# Patient Record
Sex: Female | Born: 1940
Health system: Southern US, Community
[De-identification: ages and names within clinical notes are randomized; demographics above are authoritative.]

## PROBLEM LIST (undated history)

## (undated) DIAGNOSIS — M1990 Unspecified osteoarthritis, unspecified site: Secondary | ICD-10-CM

## (undated) DIAGNOSIS — R51 Headache: Secondary | ICD-10-CM

## (undated) DIAGNOSIS — K219 Gastro-esophageal reflux disease without esophagitis: Secondary | ICD-10-CM

## (undated) DIAGNOSIS — E039 Hypothyroidism, unspecified: Secondary | ICD-10-CM

## (undated) DIAGNOSIS — R519 Headache, unspecified: Secondary | ICD-10-CM

## (undated) DIAGNOSIS — B001 Herpesviral vesicular dermatitis: Secondary | ICD-10-CM

## (undated) HISTORY — PX: TOTAL HIP ARTHROPLASTY: SHX124

## (undated) HISTORY — PX: TUBAL LIGATION: SHX77

## (undated) HISTORY — PX: COLONOSCOPY: SHX174

## (undated) HISTORY — PX: BREAST CYST EXCISION: SHX579

## (undated) HISTORY — DX: Headache: R51

## (undated) HISTORY — DX: Headache, unspecified: R51.9

## (undated) HISTORY — PX: DILATION AND CURETTAGE OF UTERUS: SHX78

---

## 1991-03-14 HISTORY — PX: BUNIONECTOMY: SHX129

## 1998-08-11 ENCOUNTER — Ambulatory Visit (HOSPITAL_COMMUNITY): Admission: RE | Admit: 1998-08-11 | Discharge: 1998-08-11 | Payer: Self-pay | Admitting: Obstetrics & Gynecology

## 2001-03-01 ENCOUNTER — Ambulatory Visit (HOSPITAL_COMMUNITY): Admission: RE | Admit: 2001-03-01 | Discharge: 2001-03-01 | Payer: Self-pay | Admitting: Internal Medicine

## 2001-08-08 ENCOUNTER — Ambulatory Visit (HOSPITAL_COMMUNITY): Admission: RE | Admit: 2001-08-08 | Discharge: 2001-08-08 | Payer: Self-pay | Admitting: Internal Medicine

## 2003-07-14 ENCOUNTER — Other Ambulatory Visit: Admission: RE | Admit: 2003-07-14 | Discharge: 2003-07-14 | Payer: Self-pay | Admitting: *Deleted

## 2005-03-13 HISTORY — PX: ERCP: SHX60

## 2006-01-29 ENCOUNTER — Ambulatory Visit: Payer: Self-pay | Admitting: Internal Medicine

## 2006-02-05 ENCOUNTER — Ambulatory Visit: Payer: Self-pay | Admitting: Internal Medicine

## 2006-02-05 ENCOUNTER — Ambulatory Visit (HOSPITAL_COMMUNITY): Admission: RE | Admit: 2006-02-05 | Discharge: 2006-02-05 | Payer: Self-pay | Admitting: Internal Medicine

## 2007-03-14 HISTORY — PX: FINGER SURGERY: SHX640

## 2007-05-28 ENCOUNTER — Ambulatory Visit (HOSPITAL_BASED_OUTPATIENT_CLINIC_OR_DEPARTMENT_OTHER): Admission: RE | Admit: 2007-05-28 | Discharge: 2007-05-28 | Payer: Self-pay | Admitting: Orthopedic Surgery

## 2010-07-26 NOTE — Op Note (Signed)
NAME:  [name] [name], [name] [name]                ACCOUNT NO.:  [id number]   MEDICAL RECORD NO.:  [id number]          PATIENT TYPE:  AMB   LOCATION:  DSC                          FACILITY:  [facility]   PHYSICIAN:  Katy Fitch. Sypher, M.D. DATE OF BIRTH:  1940/09/27   DATE OF PROCEDURE:  DATE OF DISCHARGE:                               OPERATIVE REPORT   PREOPERATIVE DIAGNOSES:  1. Degenerative arthritis, left long finger distal interphalangeal      joint, with multiple loose bodies.  2. Chronic mucoid cyst, status post failed attempt at removal by      dermatologist x2.   POSTOPERATIVE DIAGNOSES:  1. Degenerative arthritis, left long finger distal interphalangeal      joint, with multiple loose bodies.  2. Chronic mucoid cyst, status post failed attempt at removal by      dermatologist x2.  3. Identification of multiple loose bodies and advanced degenerative      arthritis of left long finger distal interphalangeal joint with      severe subcutaneous scarring and recurrent mucoid cyst, dorsal      ulnar aspect of nail fold.   OPERATION:  1. Resection of dorsal ulnar nail fold mucoid cyst and full-thickness      skin debridement of degenerative dermal cyst.  2. Dorsal radial and dorsal ulnar arthrotomy of distal interphalangeal      joint with curettage of the multiple loose bodies, synovectomy and      irrigation with a dental needle.   OPERATIONS:  Katy Fitch. Sypher, M.D.   ASSISTANT:  Jonni Sanger, P.A.   ANESTHESIA:  2% lidocaine metacarpal head-level, block left long finger,  supplemented by IV sedation, supervising anesthesiologist is Janetta Hora.  Gelene Mink, M.D.   INDICATIONS:  [name] [name] is a 70 year old woman referred for  evaluation and management of a recurrent mucoid cyst of the left long  finger.  She had advanced degenerative arthritis of multiple distal  interphalangeal joints.  She had had treatment by her attending  dermatologist, Dr. Orvan Falconer, without eradication of the  cyst.  She had  multiple recurrences and had had rupture of the cyst.   She was advised to consider formal DIP joint debridement and cyst  debridement.  She is brought to the operating room at this time.  Preoperatively she was carefully counseled, pointing out the fact we  could not change her arthritis predicament with joint debridement.  Our  goal was to eradicate the recurrent mucoid cyst and prevent the  development of septic arthritis by repeating episodes of cyst rupture.  In addition, our attention was to debride loose bodies evident on her  preoperative x-rays.  This has been shown empirically to help prevent  the recurrence of mucoid cysts.   PROCEDURE:  [name] [name] is brought to the operating room and placed in  supine position on the operating table.   Following sedation under Dr. Thornton Dales direct supervision, the left  arm was prepped with Betadine soap and solution, sterilely draped.  A 2%  lidocaine block was placed at the metacarpal head level to obtain a  digital block.  The left long finger was exsanguinated with a gauze wrap and a quarter-  inch Penrose drain placed over the proximal phalangeal segment as a  digital tourniquet.   The procedure commenced with parallel dorsal radial and dorsal ulnar  incisions.  The extensor tendon was identified, the neurovascular  structures protected, capsulotomies performed dorsal radially and dorsal  ulnarly with resection of a triangular portion of the capsule between  the extensor tendon and the collateral ligament.  A micro curette was  used to thoroughly debride the joint of synovitis and multiple loose  bodies.  The joint was then explored with dural hooks and irrigated  thoroughly with sterile saline.  The mucoid cyst was then addressed  through an extension of the dorsal ulnar incision.  A large cyst cavity  was identified.  This was circumferentially dissected and removed with a  rongeur.  The skin that was quite  degenerative and thin was resected and  debrided to a normal-appearing dermal surface.   The skin margins were mobilized, followed by irrigation and repair of  the skin margins with simple suture of 5-0 nylon.   The wound was then dressed with Xeroflo, sterile gauze and an Alumafoam  splint.  There were no apparent complications.      Katy Fitch Sypher, M.D.  Electronically Signed     RVS/MEDQ  D:  05/28/2007  T:  05/28/2007  Job:  [id number]   cc:   Marcelino Duster

## 2010-07-29 NOTE — H&P (Signed)
NAME:  [name] [name]                ACCOUNT NO.:  [id number]   MEDICAL RECORD NO.:  [id number]           PATIENT TYPE:  AMB   LOCATION:                                FACILITY:  [facility]   PHYSICIAN:  Lionel December, M.D.    DATE OF BIRTH:  07/06/1940   DATE OF ADMISSION:  DATE OF DISCHARGE:  LH                                HISTORY & PHYSICAL   CHIEF COMPLAINT:  Rectal bleeding.   HISTORY OF PRESENT ILLNESS:  [name] [name] is a 70 year old Caucasian female.  She was last seen by [name] [name] in 2003.  She has a history of rectal  bleeding.  She tells me that, for three months now, she has had small to  moderate amounts of rectal bleeding with wiping, and she has noticed it is  bright red blood on the toilet paper.  She says her bowel movements are  generally soft and brown.  She denies any proctalgia or pruritus.  She  denies any abdominal pain.  Her weight has been stable.  She denies any  diarrhea or constipation.  She is taking Advil, approximately 600 mg, daily  for the last six months.  She also complains of daily heartburn and  indigestion, has been taking Prilosec 20 more recently on a daily basis.  She has had symptoms of chronic GERD practically every day of the week for  the last year.  She complains of breakthrough symptoms, especially late in  the evening.  Denies any dysphagia or odynophagia, anorexia or early  satiety.   PAST MEDICAL HISTORY:  She had a colonoscopy by [name] [name] on March 01, 2001.  She was found to have multiple small diverticula in the sigmoid colon  and moderate external hemorrhoids were present.  She also had a flex-sig on  August 14, 2001.  I do not have a complete report, but biopsy report showed  benign, edematous, mildly inflamed mucosal tissue.  She has a history of  tubal ligation, bunion surgery and thyroid disease.   CURRENT MEDICATIONS:  __________  0.625 mg daily, calcium 600 mg daily,  Advil 600 mg daily, Anusol HC suppositories p.r.n., Valtrex  250 mg daily,  Biotin once daily, Prilosec 20 mg daily.   ALLERGIES:  NO KNOWN DRUG ALLERGIES.   FAMILY HISTORY:  There is no family history of colorectal carcinoma, liver  or chronic GI problems.   SOCIAL HISTORY:  She is married.  She has one grown healthy child.  She is  retired from [employer].  She denies any tobacco or drug use.  She  drinks alcoholic beverages a couple of times a year.   REVIEW OF SYSTEMS:  CONSTITUTIONAL:  Weight is stable, denies any fevers or  chills.  CARDIOVASCULAR:  Denies any chest pain or palpitations.  PULMONARY:  Denies shortness of breath, dyspnea, cough, hemoptysis.  GI:  See HPI.   PHYSICAL EXAMINATION:  VITAL SIGNS:  Weight 164.5 pounds, height 66-1/2  inches, temp 97.6, blood pressure 118/70 and pulse 60.  GENERAL:  [name] [name] is a well-developed, well-nourished Caucasian female in  no acute distress.  HEENT:  __________ anicteric, conjunctivae pink, oropharynx pink and moist  without any lesions.  NECK:  Supple without mass or thyromegaly.  CHEST:  Heart is regular rate and rhythm with normal S1-S2, without murmurs,  rubs or gallops.  Lungs are clear to auscultation bilaterally.  ABDOMEN:  Positive bowel sounds times four, no bruits auscultated, soft,  nontender and nondistended, without palpable masses or hepatosplenomegaly.  No voluntary guarding.  EXTREMITIES:  Without clubbing or edema bilaterally.  SKIN:  Pink, warm, dry without any rash or jaundice.  RECTAL:  No external lesions visualized.  Good sphincter tone.  No internal  masses palpated.  Small amount of light brown stool obtained from the vault  which was hemoccult negative.   IMPRESSION:  [name] [name] is a 70 year old Caucasian female with a three month  history of daily small to moderate volume rectal bleeding.  Rectal exam in  the office is benign.  Last complete colonoscopy was approximately five  years ago.  She has had a flex-sig approximately four years ago.  Given  the  duration of her symptoms, for three months, and the fact that her last  complete exam was over five years ago, I feel we should proceed with  colonoscopy to determine the etiology of her bleeding.  She does have a  history of diverticular disease and could be having diverticular bleeding,  especially with her daily NSAID use.  She also has chronic daily heartburn  and indigestion, for at least the last year.  She has never had a screening  EGD for Barrett's.  Her symptoms are fairly well controlled on Prilosec 20  mg daily.  She has rare breakthrough symptoms in the evenings.   PLAN:  1. Diagnostic colonoscopy and screening EGD by [name] [name] in the near      future.  I have discussed these procedures, including risks and      benefits, which include but are not limited to bleeding, infection,      perforation, drug reaction.  She agrees to the plan.  Consent will be      obtained.  2. She is going to continue Prilosec 20 mg daily.      Nicholas Lose, N.P.      Lionel December, M.D.  Electronically Signed    KC/MEDQ  D:  01/29/2006  T:  01/29/2006  Job:  [id number]   cc:   Lionel December, M.D.  [address]  [place]  [place] [address]

## 2010-07-29 NOTE — Op Note (Signed)
NAME:  [name] [name], [name] [name]                ACCOUNT NO.:  [id number]   MEDICAL RECORD NO.:  [id number]          PATIENT TYPE:  AMB   LOCATION:  DAY                           FACILITY:  [facility]   PHYSICIAN:  Lionel December, M.D.    DATE OF BIRTH:  11/04/1940   DATE OF PROCEDURE:  02/05/2006  DATE OF DISCHARGE:                               OPERATIVE REPORT   PROCEDURE:  Esophagogastroduodenoscopy with esophageal dilation followed  by colonoscopy.   INDICATIONS:  Romelia is 70 year old Caucasian female with chronic GERD  whose symptoms are well-controlled with PPI who is undergoing EGD  primarily looking for Barrett's esophagus but she is today also  complaining of intermittent solid food dysphagia which she did not  mention to [place] while she was in the office.  She has intermittent  hematochezia usually small amount of blood is noted with her bowel  movements, at times this is moderate amount.  She is possibly bleeding  from hemorrhoids but since this is a recurrent symptom she is undergoing  evaluation.  Her last colonoscopy was in December 2002 and she had a  flexible sigmoidoscopy in June 2003.  Her family history is negative for  colorectal carcinoma.   Procedure risks were few reviewed the patient, informed consent was  obtained.   MEDS FOR CONSCIOUS SEDATION:  Benzocaine spray pharyngeal topical  anesthesia, Demerol 50 mg IV and Versed 8 mg IV in divided dose.   FINDINGS:  Procedure performed in endoscopy suite.  The patient's vital  signs and O2 sat were monitored during procedure and remained stable.   PROCEDURE #1 ESOPHAGOGASTRODUODENOSCOPY:  The patient was placed in left  lateral position and Olympus videoscope was passed through oropharynx  without any difficulty into esophagus.   Esophagus. Mucosa of the esophagus was normal throughout.  GE junction  was wavy, located at 38 cm from the incisors, but no ring or stricture  was noted.  Hiatus was at 40 cm from the incisors.   Stomach.  It was empty and distended very well with insufflation.  Folds  of proximal stomach were normal.  Examination mucosa at body, antrum,  pyloric channel as well as angularis, fundus and cardia was normal.   Duodenum bulbar mucosa was normal.  Scope was passed second part of  duodenum where mucosa and folds were normal.  Endoscope was withdrawn.   Esophagus was dilated by passing 56-French Maloney dilator to full  insertion.  Esophageal mucosa was reexamined post ED and no mucosal  disruption noted.  The patient was subsequently prepared for procedure  #2.   Colonoscopy.  Rectal examination performed.  No abnormality noted  external or digital exam.  Olympus videoscope was placed in rectum where  mucosa was noted to be normal.  Scope was retroflexed to examine  anorectal junction and moderate-sized hemorrhoids were noted below the  dentate line.  Pictures taken for the record.  Endoscope was  straightened and advanced to sigmoid colon beyond.  Preparation was  excellent.  Few small scattered diverticula noted at sigmoid colon.  Scope was passed into cecum which was identified by appendiceal  orifice  and ileocecal valve.  Short segment of TI was also examined was normal.  As the scope was withdrawn colonic mucosa was examined for the second  time and no orifice and ileocecal valve.  There was a 5-mm AVM at cecum  which was not bleeding and was left alone.  Short segment of TI was a  was also examined and was normal.  Colonic mucosa was carefully examined  as the scope was slowly withdrawn.  No polyps or other mucosal  abnormalities were noted.  Endoscope was withdrawn.  The patient  tolerated the procedure well.   FINAL DIAGNOSIS:  Small sliding hiatal hernia otherwise normal EGD.   Esophagus dilated by passing 56-French Maloney dilator given history of  dysphagia but no mucosal disruption noted in the esophagus.   External hemorrhoids felt to be source of recurrent/chronic   hematochezia.   Few small scattered diverticula at sigmoid colon.  Next a small  nonbleeding cecal AVM which was left alone.   RECOMMENDATIONS:  She will continue antireflux measures and Prilosec as  before.  She will also continue high-fiber diet.  __________ suppository  one per rectum at bedtime for 2-4 weeks.  Prescription given for 30  without refill.  She will keep symptom diary and bring [place] up to date in  3 months.      Lionel December, M.D.  Electronically Signed     NR/MEDQ  D:  02/05/2006  T:  02/05/2006  Job:  [id number]   cc:   Weyman Pedro  [address]  [place] [address]

## 2010-12-05 LAB — POCT HEMOGLOBIN-HEMACUE: Hemoglobin: 14.1

## 2011-04-25 DIAGNOSIS — M5430 Sciatica, unspecified side: Secondary | ICD-10-CM | POA: Diagnosis not present

## 2011-04-25 DIAGNOSIS — S335XXA Sprain of ligaments of lumbar spine, initial encounter: Secondary | ICD-10-CM | POA: Diagnosis not present

## 2011-04-25 DIAGNOSIS — M999 Biomechanical lesion, unspecified: Secondary | ICD-10-CM | POA: Diagnosis not present

## 2011-04-27 DIAGNOSIS — M999 Biomechanical lesion, unspecified: Secondary | ICD-10-CM | POA: Diagnosis not present

## 2011-04-27 DIAGNOSIS — M5430 Sciatica, unspecified side: Secondary | ICD-10-CM | POA: Diagnosis not present

## 2011-04-27 DIAGNOSIS — S335XXA Sprain of ligaments of lumbar spine, initial encounter: Secondary | ICD-10-CM | POA: Diagnosis not present

## 2011-05-01 DIAGNOSIS — S335XXA Sprain of ligaments of lumbar spine, initial encounter: Secondary | ICD-10-CM | POA: Diagnosis not present

## 2011-05-01 DIAGNOSIS — M5430 Sciatica, unspecified side: Secondary | ICD-10-CM | POA: Diagnosis not present

## 2011-05-01 DIAGNOSIS — M999 Biomechanical lesion, unspecified: Secondary | ICD-10-CM | POA: Diagnosis not present

## 2011-05-04 DIAGNOSIS — S335XXA Sprain of ligaments of lumbar spine, initial encounter: Secondary | ICD-10-CM | POA: Diagnosis not present

## 2011-05-04 DIAGNOSIS — M5430 Sciatica, unspecified side: Secondary | ICD-10-CM | POA: Diagnosis not present

## 2011-05-04 DIAGNOSIS — M999 Biomechanical lesion, unspecified: Secondary | ICD-10-CM | POA: Diagnosis not present

## 2011-05-11 DIAGNOSIS — M5430 Sciatica, unspecified side: Secondary | ICD-10-CM | POA: Diagnosis not present

## 2011-05-11 DIAGNOSIS — M999 Biomechanical lesion, unspecified: Secondary | ICD-10-CM | POA: Diagnosis not present

## 2011-05-18 DIAGNOSIS — M5430 Sciatica, unspecified side: Secondary | ICD-10-CM | POA: Diagnosis not present

## 2011-05-18 DIAGNOSIS — M999 Biomechanical lesion, unspecified: Secondary | ICD-10-CM | POA: Diagnosis not present

## 2011-05-24 DIAGNOSIS — Z1231 Encounter for screening mammogram for malignant neoplasm of breast: Secondary | ICD-10-CM | POA: Diagnosis not present

## 2011-05-25 DIAGNOSIS — S335XXA Sprain of ligaments of lumbar spine, initial encounter: Secondary | ICD-10-CM | POA: Diagnosis not present

## 2011-05-25 DIAGNOSIS — M5430 Sciatica, unspecified side: Secondary | ICD-10-CM | POA: Diagnosis not present

## 2011-05-25 DIAGNOSIS — M999 Biomechanical lesion, unspecified: Secondary | ICD-10-CM | POA: Diagnosis not present

## 2011-06-02 DIAGNOSIS — M25519 Pain in unspecified shoulder: Secondary | ICD-10-CM | POA: Diagnosis not present

## 2011-06-02 DIAGNOSIS — M67919 Unspecified disorder of synovium and tendon, unspecified shoulder: Secondary | ICD-10-CM | POA: Diagnosis not present

## 2011-06-06 DIAGNOSIS — K219 Gastro-esophageal reflux disease without esophagitis: Secondary | ICD-10-CM | POA: Diagnosis not present

## 2011-06-27 DIAGNOSIS — G9001 Carotid sinus syncope: Secondary | ICD-10-CM | POA: Diagnosis not present

## 2011-07-15 DIAGNOSIS — IMO0002 Reserved for concepts with insufficient information to code with codable children: Secondary | ICD-10-CM | POA: Diagnosis not present

## 2011-08-08 DIAGNOSIS — M169 Osteoarthritis of hip, unspecified: Secondary | ICD-10-CM | POA: Diagnosis not present

## 2011-08-08 DIAGNOSIS — M1610 Unilateral primary osteoarthritis, unspecified hip: Secondary | ICD-10-CM | POA: Diagnosis not present

## 2011-08-08 DIAGNOSIS — M25559 Pain in unspecified hip: Secondary | ICD-10-CM | POA: Diagnosis not present

## 2011-08-16 DIAGNOSIS — M25559 Pain in unspecified hip: Secondary | ICD-10-CM | POA: Diagnosis not present

## 2011-08-16 DIAGNOSIS — M169 Osteoarthritis of hip, unspecified: Secondary | ICD-10-CM | POA: Diagnosis not present

## 2011-08-16 DIAGNOSIS — M1610 Unilateral primary osteoarthritis, unspecified hip: Secondary | ICD-10-CM | POA: Diagnosis not present

## 2011-08-22 DIAGNOSIS — M1610 Unilateral primary osteoarthritis, unspecified hip: Secondary | ICD-10-CM | POA: Diagnosis not present

## 2011-10-04 DIAGNOSIS — R7989 Other specified abnormal findings of blood chemistry: Secondary | ICD-10-CM | POA: Diagnosis not present

## 2011-10-04 DIAGNOSIS — M169 Osteoarthritis of hip, unspecified: Secondary | ICD-10-CM | POA: Diagnosis not present

## 2011-10-12 HISTORY — PX: JOINT REPLACEMENT: SHX530

## 2011-10-16 DIAGNOSIS — E7800 Pure hypercholesterolemia, unspecified: Secondary | ICD-10-CM | POA: Diagnosis not present

## 2011-10-16 DIAGNOSIS — Z79899 Other long term (current) drug therapy: Secondary | ICD-10-CM | POA: Diagnosis not present

## 2011-10-20 DIAGNOSIS — Z0000 Encounter for general adult medical examination without abnormal findings: Secondary | ICD-10-CM | POA: Diagnosis not present

## 2011-10-20 DIAGNOSIS — B009 Herpesviral infection, unspecified: Secondary | ICD-10-CM | POA: Diagnosis not present

## 2011-10-20 DIAGNOSIS — G4762 Sleep related leg cramps: Secondary | ICD-10-CM | POA: Diagnosis not present

## 2011-10-24 DIAGNOSIS — I498 Other specified cardiac arrhythmias: Secondary | ICD-10-CM | POA: Diagnosis not present

## 2011-10-30 DIAGNOSIS — M169 Osteoarthritis of hip, unspecified: Secondary | ICD-10-CM | POA: Diagnosis not present

## 2011-10-30 DIAGNOSIS — E039 Hypothyroidism, unspecified: Secondary | ICD-10-CM | POA: Diagnosis present

## 2011-10-30 DIAGNOSIS — G8918 Other acute postprocedural pain: Secondary | ICD-10-CM | POA: Diagnosis not present

## 2011-10-30 DIAGNOSIS — M1610 Unilateral primary osteoarthritis, unspecified hip: Secondary | ICD-10-CM | POA: Diagnosis not present

## 2011-10-30 DIAGNOSIS — Z471 Aftercare following joint replacement surgery: Secondary | ICD-10-CM | POA: Diagnosis not present

## 2011-10-30 DIAGNOSIS — Z96649 Presence of unspecified artificial hip joint: Secondary | ICD-10-CM | POA: Diagnosis not present

## 2011-10-30 DIAGNOSIS — E876 Hypokalemia: Secondary | ICD-10-CM | POA: Diagnosis present

## 2011-10-30 DIAGNOSIS — K219 Gastro-esophageal reflux disease without esophagitis: Secondary | ICD-10-CM | POA: Diagnosis present

## 2011-11-03 DIAGNOSIS — Z471 Aftercare following joint replacement surgery: Secondary | ICD-10-CM | POA: Diagnosis not present

## 2011-11-03 DIAGNOSIS — G8918 Other acute postprocedural pain: Secondary | ICD-10-CM | POA: Diagnosis not present

## 2011-11-03 DIAGNOSIS — M6281 Muscle weakness (generalized): Secondary | ICD-10-CM | POA: Diagnosis not present

## 2011-11-03 DIAGNOSIS — Z96649 Presence of unspecified artificial hip joint: Secondary | ICD-10-CM | POA: Diagnosis not present

## 2011-11-03 DIAGNOSIS — R269 Unspecified abnormalities of gait and mobility: Secondary | ICD-10-CM | POA: Diagnosis not present

## 2011-11-04 DIAGNOSIS — M6281 Muscle weakness (generalized): Secondary | ICD-10-CM | POA: Diagnosis not present

## 2011-11-04 DIAGNOSIS — Z96649 Presence of unspecified artificial hip joint: Secondary | ICD-10-CM | POA: Diagnosis not present

## 2011-11-04 DIAGNOSIS — R269 Unspecified abnormalities of gait and mobility: Secondary | ICD-10-CM | POA: Diagnosis not present

## 2011-11-04 DIAGNOSIS — G8918 Other acute postprocedural pain: Secondary | ICD-10-CM | POA: Diagnosis not present

## 2011-11-04 DIAGNOSIS — Z471 Aftercare following joint replacement surgery: Secondary | ICD-10-CM | POA: Diagnosis not present

## 2011-11-05 DIAGNOSIS — Z471 Aftercare following joint replacement surgery: Secondary | ICD-10-CM | POA: Diagnosis not present

## 2011-11-05 DIAGNOSIS — Z96649 Presence of unspecified artificial hip joint: Secondary | ICD-10-CM | POA: Diagnosis not present

## 2011-11-05 DIAGNOSIS — R269 Unspecified abnormalities of gait and mobility: Secondary | ICD-10-CM | POA: Diagnosis not present

## 2011-11-05 DIAGNOSIS — G8918 Other acute postprocedural pain: Secondary | ICD-10-CM | POA: Diagnosis not present

## 2011-11-05 DIAGNOSIS — M6281 Muscle weakness (generalized): Secondary | ICD-10-CM | POA: Diagnosis not present

## 2011-11-07 DIAGNOSIS — Z96649 Presence of unspecified artificial hip joint: Secondary | ICD-10-CM | POA: Diagnosis not present

## 2011-11-07 DIAGNOSIS — Z471 Aftercare following joint replacement surgery: Secondary | ICD-10-CM | POA: Diagnosis not present

## 2011-11-07 DIAGNOSIS — M6281 Muscle weakness (generalized): Secondary | ICD-10-CM | POA: Diagnosis not present

## 2011-11-07 DIAGNOSIS — R269 Unspecified abnormalities of gait and mobility: Secondary | ICD-10-CM | POA: Diagnosis not present

## 2011-11-07 DIAGNOSIS — G8918 Other acute postprocedural pain: Secondary | ICD-10-CM | POA: Diagnosis not present

## 2011-11-08 DIAGNOSIS — M6281 Muscle weakness (generalized): Secondary | ICD-10-CM | POA: Diagnosis not present

## 2011-11-08 DIAGNOSIS — Z96649 Presence of unspecified artificial hip joint: Secondary | ICD-10-CM | POA: Diagnosis not present

## 2011-11-08 DIAGNOSIS — R269 Unspecified abnormalities of gait and mobility: Secondary | ICD-10-CM | POA: Diagnosis not present

## 2011-11-08 DIAGNOSIS — G8918 Other acute postprocedural pain: Secondary | ICD-10-CM | POA: Diagnosis not present

## 2011-11-08 DIAGNOSIS — Z471 Aftercare following joint replacement surgery: Secondary | ICD-10-CM | POA: Diagnosis not present

## 2011-11-10 DIAGNOSIS — G8918 Other acute postprocedural pain: Secondary | ICD-10-CM | POA: Diagnosis not present

## 2011-11-10 DIAGNOSIS — Z96649 Presence of unspecified artificial hip joint: Secondary | ICD-10-CM | POA: Diagnosis not present

## 2011-11-10 DIAGNOSIS — M6281 Muscle weakness (generalized): Secondary | ICD-10-CM | POA: Diagnosis not present

## 2011-11-10 DIAGNOSIS — R269 Unspecified abnormalities of gait and mobility: Secondary | ICD-10-CM | POA: Diagnosis not present

## 2011-11-10 DIAGNOSIS — Z471 Aftercare following joint replacement surgery: Secondary | ICD-10-CM | POA: Diagnosis not present

## 2011-11-11 DIAGNOSIS — G8918 Other acute postprocedural pain: Secondary | ICD-10-CM | POA: Diagnosis not present

## 2011-11-11 DIAGNOSIS — R269 Unspecified abnormalities of gait and mobility: Secondary | ICD-10-CM | POA: Diagnosis not present

## 2011-11-11 DIAGNOSIS — Z96649 Presence of unspecified artificial hip joint: Secondary | ICD-10-CM | POA: Diagnosis not present

## 2011-11-11 DIAGNOSIS — M6281 Muscle weakness (generalized): Secondary | ICD-10-CM | POA: Diagnosis not present

## 2011-11-11 DIAGNOSIS — Z471 Aftercare following joint replacement surgery: Secondary | ICD-10-CM | POA: Diagnosis not present

## 2011-11-15 DIAGNOSIS — M6281 Muscle weakness (generalized): Secondary | ICD-10-CM | POA: Diagnosis not present

## 2011-11-15 DIAGNOSIS — Z471 Aftercare following joint replacement surgery: Secondary | ICD-10-CM | POA: Diagnosis not present

## 2011-11-15 DIAGNOSIS — Z96649 Presence of unspecified artificial hip joint: Secondary | ICD-10-CM | POA: Diagnosis not present

## 2011-11-15 DIAGNOSIS — G8918 Other acute postprocedural pain: Secondary | ICD-10-CM | POA: Diagnosis not present

## 2011-11-15 DIAGNOSIS — R269 Unspecified abnormalities of gait and mobility: Secondary | ICD-10-CM | POA: Diagnosis not present

## 2011-11-16 DIAGNOSIS — R269 Unspecified abnormalities of gait and mobility: Secondary | ICD-10-CM | POA: Diagnosis not present

## 2011-11-16 DIAGNOSIS — Z96649 Presence of unspecified artificial hip joint: Secondary | ICD-10-CM | POA: Diagnosis not present

## 2011-11-16 DIAGNOSIS — G8918 Other acute postprocedural pain: Secondary | ICD-10-CM | POA: Diagnosis not present

## 2011-11-16 DIAGNOSIS — M6281 Muscle weakness (generalized): Secondary | ICD-10-CM | POA: Diagnosis not present

## 2011-11-16 DIAGNOSIS — Z471 Aftercare following joint replacement surgery: Secondary | ICD-10-CM | POA: Diagnosis not present

## 2011-11-18 DIAGNOSIS — Z471 Aftercare following joint replacement surgery: Secondary | ICD-10-CM | POA: Diagnosis not present

## 2011-11-18 DIAGNOSIS — G8918 Other acute postprocedural pain: Secondary | ICD-10-CM | POA: Diagnosis not present

## 2011-11-18 DIAGNOSIS — Z96649 Presence of unspecified artificial hip joint: Secondary | ICD-10-CM | POA: Diagnosis not present

## 2011-11-18 DIAGNOSIS — R269 Unspecified abnormalities of gait and mobility: Secondary | ICD-10-CM | POA: Diagnosis not present

## 2011-11-18 DIAGNOSIS — M6281 Muscle weakness (generalized): Secondary | ICD-10-CM | POA: Diagnosis not present

## 2011-11-22 DIAGNOSIS — Z96649 Presence of unspecified artificial hip joint: Secondary | ICD-10-CM | POA: Diagnosis not present

## 2011-11-22 DIAGNOSIS — M6281 Muscle weakness (generalized): Secondary | ICD-10-CM | POA: Diagnosis not present

## 2011-11-22 DIAGNOSIS — G8918 Other acute postprocedural pain: Secondary | ICD-10-CM | POA: Diagnosis not present

## 2011-11-22 DIAGNOSIS — Z471 Aftercare following joint replacement surgery: Secondary | ICD-10-CM | POA: Diagnosis not present

## 2011-11-22 DIAGNOSIS — R269 Unspecified abnormalities of gait and mobility: Secondary | ICD-10-CM | POA: Diagnosis not present

## 2011-11-25 DIAGNOSIS — M6281 Muscle weakness (generalized): Secondary | ICD-10-CM | POA: Diagnosis not present

## 2011-11-25 DIAGNOSIS — G8918 Other acute postprocedural pain: Secondary | ICD-10-CM | POA: Diagnosis not present

## 2011-11-25 DIAGNOSIS — R269 Unspecified abnormalities of gait and mobility: Secondary | ICD-10-CM | POA: Diagnosis not present

## 2011-11-25 DIAGNOSIS — Z471 Aftercare following joint replacement surgery: Secondary | ICD-10-CM | POA: Diagnosis not present

## 2011-11-25 DIAGNOSIS — Z96649 Presence of unspecified artificial hip joint: Secondary | ICD-10-CM | POA: Diagnosis not present

## 2011-12-13 DIAGNOSIS — M1710 Unilateral primary osteoarthritis, unspecified knee: Secondary | ICD-10-CM | POA: Diagnosis not present

## 2011-12-13 DIAGNOSIS — Z96649 Presence of unspecified artificial hip joint: Secondary | ICD-10-CM | POA: Diagnosis not present

## 2011-12-13 DIAGNOSIS — M25469 Effusion, unspecified knee: Secondary | ICD-10-CM | POA: Diagnosis not present

## 2011-12-13 DIAGNOSIS — Z471 Aftercare following joint replacement surgery: Secondary | ICD-10-CM | POA: Diagnosis not present

## 2011-12-28 DIAGNOSIS — Z23 Encounter for immunization: Secondary | ICD-10-CM | POA: Diagnosis not present

## 2012-01-24 DIAGNOSIS — L821 Other seborrheic keratosis: Secondary | ICD-10-CM | POA: Diagnosis not present

## 2012-01-24 DIAGNOSIS — L570 Actinic keratosis: Secondary | ICD-10-CM | POA: Diagnosis not present

## 2012-01-24 DIAGNOSIS — L819 Disorder of pigmentation, unspecified: Secondary | ICD-10-CM | POA: Diagnosis not present

## 2012-01-25 DIAGNOSIS — Z471 Aftercare following joint replacement surgery: Secondary | ICD-10-CM | POA: Diagnosis not present

## 2012-01-25 DIAGNOSIS — Z96649 Presence of unspecified artificial hip joint: Secondary | ICD-10-CM | POA: Diagnosis not present

## 2012-04-24 DIAGNOSIS — K219 Gastro-esophageal reflux disease without esophagitis: Secondary | ICD-10-CM | POA: Diagnosis not present

## 2012-04-24 DIAGNOSIS — R5381 Other malaise: Secondary | ICD-10-CM | POA: Diagnosis not present

## 2012-05-08 DIAGNOSIS — L909 Atrophic disorder of skin, unspecified: Secondary | ICD-10-CM | POA: Diagnosis not present

## 2012-05-08 DIAGNOSIS — B079 Viral wart, unspecified: Secondary | ICD-10-CM | POA: Diagnosis not present

## 2012-05-08 DIAGNOSIS — D485 Neoplasm of uncertain behavior of skin: Secondary | ICD-10-CM | POA: Diagnosis not present

## 2012-05-08 DIAGNOSIS — L820 Inflamed seborrheic keratosis: Secondary | ICD-10-CM | POA: Diagnosis not present

## 2012-05-08 DIAGNOSIS — L919 Hypertrophic disorder of the skin, unspecified: Secondary | ICD-10-CM | POA: Diagnosis not present

## 2012-05-29 DIAGNOSIS — D492 Neoplasm of unspecified behavior of bone, soft tissue, and skin: Secondary | ICD-10-CM | POA: Diagnosis not present

## 2012-05-29 DIAGNOSIS — M19049 Primary osteoarthritis, unspecified hand: Secondary | ICD-10-CM | POA: Diagnosis not present

## 2012-05-29 DIAGNOSIS — Z1231 Encounter for screening mammogram for malignant neoplasm of breast: Secondary | ICD-10-CM | POA: Diagnosis not present

## 2012-05-30 ENCOUNTER — Encounter (HOSPITAL_BASED_OUTPATIENT_CLINIC_OR_DEPARTMENT_OTHER): Payer: Self-pay | Admitting: *Deleted

## 2012-05-30 ENCOUNTER — Other Ambulatory Visit: Payer: Self-pay | Admitting: Orthopedic Surgery

## 2012-05-31 ENCOUNTER — Encounter (HOSPITAL_BASED_OUTPATIENT_CLINIC_OR_DEPARTMENT_OTHER): Payer: Self-pay | Admitting: *Deleted

## 2012-05-31 NOTE — Progress Notes (Signed)
No labs needed

## 2012-06-10 DIAGNOSIS — J158 Pneumonia due to other specified bacteria: Secondary | ICD-10-CM | POA: Diagnosis not present

## 2012-06-10 DIAGNOSIS — R509 Fever, unspecified: Secondary | ICD-10-CM | POA: Diagnosis not present

## 2012-07-05 ENCOUNTER — Encounter (HOSPITAL_BASED_OUTPATIENT_CLINIC_OR_DEPARTMENT_OTHER): Payer: Self-pay | Admitting: *Deleted

## 2012-07-05 ENCOUNTER — Other Ambulatory Visit: Payer: Self-pay | Admitting: Orthopedic Surgery

## 2012-07-05 DIAGNOSIS — M19049 Primary osteoarthritis, unspecified hand: Secondary | ICD-10-CM | POA: Diagnosis not present

## 2012-07-05 DIAGNOSIS — D492 Neoplasm of unspecified behavior of bone, soft tissue, and skin: Secondary | ICD-10-CM | POA: Diagnosis not present

## 2012-07-05 NOTE — Progress Notes (Signed)
Surgery was r/s due to infection in hand-no labs needed

## 2012-07-09 ENCOUNTER — Encounter (HOSPITAL_BASED_OUTPATIENT_CLINIC_OR_DEPARTMENT_OTHER): Payer: Self-pay

## 2012-07-09 ENCOUNTER — Encounter (HOSPITAL_BASED_OUTPATIENT_CLINIC_OR_DEPARTMENT_OTHER): Payer: Self-pay | Admitting: Certified Registered Nurse Anesthetist

## 2012-07-09 ENCOUNTER — Ambulatory Visit (HOSPITAL_BASED_OUTPATIENT_CLINIC_OR_DEPARTMENT_OTHER)
Admission: RE | Admit: 2012-07-09 | Discharge: 2012-07-09 | Disposition: A | Discharge status: Home / Self Care | Payer: Medicare Other | Source: Ambulatory Visit | Attending: Orthopedic Surgery | Admitting: Orthopedic Surgery

## 2012-07-09 ENCOUNTER — Ambulatory Visit (HOSPITAL_BASED_OUTPATIENT_CLINIC_OR_DEPARTMENT_OTHER): Payer: Medicare Other | Admitting: Certified Registered Nurse Anesthetist

## 2012-07-09 ENCOUNTER — Encounter (HOSPITAL_BASED_OUTPATIENT_CLINIC_OR_DEPARTMENT_OTHER)
Admission: RE | Disposition: A | Discharge status: Home / Self Care | Payer: Self-pay | Source: Ambulatory Visit | Attending: Orthopedic Surgery

## 2012-07-09 DIAGNOSIS — M6740 Ganglion, unspecified site: Secondary | ICD-10-CM | POA: Diagnosis not present

## 2012-07-09 DIAGNOSIS — M898X9 Other specified disorders of bone, unspecified site: Secondary | ICD-10-CM | POA: Insufficient documentation

## 2012-07-09 DIAGNOSIS — Z79899 Other long term (current) drug therapy: Secondary | ICD-10-CM | POA: Insufficient documentation

## 2012-07-09 DIAGNOSIS — E039 Hypothyroidism, unspecified: Secondary | ICD-10-CM | POA: Diagnosis not present

## 2012-07-09 DIAGNOSIS — M129 Arthropathy, unspecified: Secondary | ICD-10-CM | POA: Insufficient documentation

## 2012-07-09 DIAGNOSIS — K219 Gastro-esophageal reflux disease without esophagitis: Secondary | ICD-10-CM | POA: Diagnosis not present

## 2012-07-09 DIAGNOSIS — D2110 Benign neoplasm of connective and other soft tissue of unspecified upper limb, including shoulder: Secondary | ICD-10-CM | POA: Diagnosis not present

## 2012-07-09 DIAGNOSIS — D492 Neoplasm of unspecified behavior of bone, soft tissue, and skin: Secondary | ICD-10-CM | POA: Diagnosis not present

## 2012-07-09 DIAGNOSIS — M19049 Primary osteoarthritis, unspecified hand: Secondary | ICD-10-CM | POA: Diagnosis not present

## 2012-07-09 SURGERY — EXCISION METACARPAL MASS
Anesthesia: General | Site: Finger | Laterality: Right | Wound class: Clean

## 2012-07-09 MED ORDER — CHLORHEXIDINE GLUCONATE 4 % EX LIQD: 60.0000 mL | Freq: Once | CUTANEOUS | Status: DC

## 2012-07-09 MED ORDER — OXYCODONE-ACETAMINOPHEN 5-325 MG PO TABS: ORAL_TABLET | ORAL | Status: DC

## 2012-07-09 MED ORDER — MIDAZOLAM HCL 2 MG/2ML IJ SOLN
1.0000 mg | INTRAMUSCULAR | Status: DC | PRN
Start: 2012-07-09 — End: 2012-07-09

## 2012-07-09 MED ORDER — ONDANSETRON HCL 4 MG/2ML IJ SOLN
INTRAMUSCULAR | Status: DC | PRN
  Administered 2012-07-09: 4 mg via INTRAVENOUS

## 2012-07-09 MED ORDER — FENTANYL CITRATE 0.05 MG/ML IJ SOLN: 50.0000 ug | INTRAMUSCULAR | Status: DC | PRN

## 2012-07-09 MED ORDER — ONDANSETRON HCL 4 MG/2ML IJ SOLN: 4.0000 mg | Freq: Once | INTRAMUSCULAR | Status: DC | PRN

## 2012-07-09 MED ORDER — LIDOCAINE HCL (CARDIAC) 20 MG/ML IV SOLN
INTRAVENOUS | Status: DC | PRN
  Administered 2012-07-09: 60 mg via INTRAVENOUS

## 2012-07-09 MED ORDER — BUPIVACAINE HCL (PF) 0.25 % IJ SOLN
INTRAMUSCULAR | Status: DC | PRN
  Administered 2012-07-09: 10 mL

## 2012-07-09 MED ORDER — OXYCODONE HCL 5 MG/5ML PO SOLN: 5.0000 mg | Freq: Once | ORAL | Status: DC | PRN

## 2012-07-09 MED ORDER — OXYCODONE HCL 5 MG PO TABS: 5.0000 mg | ORAL_TABLET | Freq: Once | ORAL | Status: DC | PRN

## 2012-07-09 MED ORDER — HYDROMORPHONE HCL PF 1 MG/ML IJ SOLN: 0.2500 mg | INTRAMUSCULAR | Status: DC | PRN

## 2012-07-09 MED ORDER — FENTANYL CITRATE 0.05 MG/ML IJ SOLN
INTRAMUSCULAR | Status: DC | PRN
  Administered 2012-07-09: 50 ug via INTRAVENOUS

## 2012-07-09 MED ORDER — CEFAZOLIN SODIUM-DEXTROSE 2-3 GM-% IV SOLR
2.0000 g | INTRAVENOUS | Status: AC
  Administered 2012-07-09: 2 g via INTRAVENOUS

## 2012-07-09 MED ORDER — LACTATED RINGERS IV SOLN
INTRAVENOUS | Status: DC
  Administered 2012-07-09 (×2): via INTRAVENOUS

## 2012-07-09 MED ORDER — DEXAMETHASONE SODIUM PHOSPHATE 10 MG/ML IJ SOLN
INTRAMUSCULAR | Status: DC | PRN
  Administered 2012-07-09: 10 mg via INTRAVENOUS

## 2012-07-09 MED ORDER — PROPOFOL 10 MG/ML IV BOLUS
INTRAVENOUS | Status: DC | PRN
  Administered 2012-07-09: 150 mg via INTRAVENOUS

## 2012-07-09 SURGICAL SUPPLY — 56 items
APL SKNCLS STERI-STRIP NONHPOA (GAUZE/BANDAGES/DRESSINGS)
BANDAGE COBAN STERILE 2 (GAUZE/BANDAGES/DRESSINGS) IMPLANT
BANDAGE CONFORM 2  STR LF (GAUZE/BANDAGES/DRESSINGS) IMPLANT
BANDAGE ELASTIC 3 VELCRO ST LF (GAUZE/BANDAGES/DRESSINGS) IMPLANT
BANDAGE GAUZE ELAST BULKY 4 IN (GAUZE/BANDAGES/DRESSINGS) IMPLANT
BANDAGE GAUZE STRT 1 STR LF (GAUZE/BANDAGES/DRESSINGS) IMPLANT
BENZOIN TINCTURE PRP APPL 2/3 (GAUZE/BANDAGES/DRESSINGS) IMPLANT
BLADE MINI RND TIP GREEN BEAV (BLADE) IMPLANT
BLADE SURG 15 STRL LF DISP TIS (BLADE) ×2 IMPLANT
BLADE SURG 15 STRL SS (BLADE) ×4
BNDG CMPR 9X4 STRL LF SNTH (GAUZE/BANDAGES/DRESSINGS) ×1
BNDG CMPR MD 5X2 ELC HKLP STRL (GAUZE/BANDAGES/DRESSINGS)
BNDG COHESIVE 1X5 TAN STRL LF (GAUZE/BANDAGES/DRESSINGS) ×1 IMPLANT
BNDG ELASTIC 2 VLCR STRL LF (GAUZE/BANDAGES/DRESSINGS) IMPLANT
BNDG ESMARK 4X9 LF (GAUZE/BANDAGES/DRESSINGS) ×1 IMPLANT
BNDG PLASTER X FAST 3X3 WHT LF (CAST SUPPLIES) IMPLANT
BNDG PLSTR 9X3 FST ST WHT (CAST SUPPLIES)
CHLORAPREP W/TINT 26ML (MISCELLANEOUS) ×2 IMPLANT
CLOTH BEACON ORANGE TIMEOUT ST (SAFETY) ×2 IMPLANT
CORDS BIPOLAR (ELECTRODE) ×2 IMPLANT
COVER MAYO STAND STRL (DRAPES) ×2 IMPLANT
COVER TABLE BACK 60X90 (DRAPES) ×2 IMPLANT
CUFF TOURNIQUET SINGLE 18IN (TOURNIQUET CUFF) ×2 IMPLANT
DRAPE EXTREMITY T 121X128X90 (DRAPE) ×2 IMPLANT
DRAPE SURG 17X23 STRL (DRAPES) ×2 IMPLANT
GAUZE XEROFORM 1X8 LF (GAUZE/BANDAGES/DRESSINGS) ×2 IMPLANT
GLOVE BIO SURGEON STRL SZ 6.5 (GLOVE) ×1 IMPLANT
GLOVE BIO SURGEON STRL SZ7.5 (GLOVE) ×2 IMPLANT
GLOVE BIOGEL PI IND STRL 7.0 (GLOVE) IMPLANT
GLOVE BIOGEL PI IND STRL 8 (GLOVE) ×1 IMPLANT
GLOVE BIOGEL PI INDICATOR 7.0 (GLOVE) ×1
GLOVE BIOGEL PI INDICATOR 8 (GLOVE) ×1
GLOVE EXAM NITRILE MD LF STRL (GLOVE) ×1 IMPLANT
GOWN BRE IMP PREV XXLGXLNG (GOWN DISPOSABLE) ×3 IMPLANT
GOWN PREVENTION PLUS XLARGE (GOWN DISPOSABLE) ×2 IMPLANT
NDL HYPO 25X1 1.5 SAFETY (NEEDLE) ×1 IMPLANT
NEEDLE HYPO 25X1 1.5 SAFETY (NEEDLE) ×2 IMPLANT
NS IRRIG 1000ML POUR BTL (IV SOLUTION) ×2 IMPLANT
PACK BASIN DAY SURGERY FS (CUSTOM PROCEDURE TRAY) ×2 IMPLANT
PAD CAST 3X4 CTTN HI CHSV (CAST SUPPLIES) IMPLANT
PAD CAST 4YDX4 CTTN HI CHSV (CAST SUPPLIES) IMPLANT
PADDING CAST ABS 4INX4YD NS (CAST SUPPLIES) ×1
PADDING CAST ABS COTTON 4X4 ST (CAST SUPPLIES) ×1 IMPLANT
PADDING CAST COTTON 3X4 STRL (CAST SUPPLIES)
PADDING CAST COTTON 4X4 STRL (CAST SUPPLIES)
SPLINT FNGR PLAIN END 5/8X3.25 (CAST SUPPLIES) IMPLANT
SPLINT PLASTALUME 3 1/4 (CAST SUPPLIES) ×2
SPONGE GAUZE 4X4 12PLY (GAUZE/BANDAGES/DRESSINGS) ×2 IMPLANT
STOCKINETTE 4X48 STRL (DRAPES) ×2 IMPLANT
STRIP CLOSURE SKIN 1/2X4 (GAUZE/BANDAGES/DRESSINGS) IMPLANT
SUT ETHILON 3 0 PS 1 (SUTURE) IMPLANT
SUT ETHILON 4 0 PS 2 18 (SUTURE) ×2 IMPLANT
SYR BULB 3OZ (MISCELLANEOUS) ×2 IMPLANT
SYR CONTROL 10ML LL (SYRINGE) ×2 IMPLANT
TOWEL OR 17X24 6PK STRL BLUE (TOWEL DISPOSABLE) ×4 IMPLANT
UNDERPAD 30X30 INCONTINENT (UNDERPADS AND DIAPERS) ×2 IMPLANT

## 2012-07-09 NOTE — Anesthesia Postprocedure Evaluation (Signed)
  Anesthesia Post-op Note  Patient: [name] [name]  Procedure(s) Performed: Procedure(s): RIGHT LONG FINGER EXCISION MASS AND DIP JOINT DEBRIDEMENT  (Right)  Patient Location: PACU  Anesthesia Type:General  Level of Consciousness: awake, alert  and oriented  Airway and Oxygen Therapy: Patient Spontanous Breathing  Post-op Pain: mild  Post-op Assessment: Post-op Vital signs reviewed  Post-op Vital Signs: Reviewed  Complications: No apparent anesthesia complications

## 2012-07-09 NOTE — Transfer of Care (Signed)
Immediate Anesthesia Transfer of Care Note  Patient: [name] [name]  Procedure(s) Performed: Procedure(s): RIGHT LONG FINGER EXCISION MASS AND DIP JOINT DEBRIDEMENT  (Right)  Patient Location: PACU  Anesthesia Type:General  Level of Consciousness: awake, alert , oriented and patient cooperative  Airway & Oxygen Therapy: Patient Spontanous Breathing and Patient connected to face mask oxygen  Post-op Assessment: Report given to PACU RN and Post -op Vital signs reviewed and stable  Post vital signs: Reviewed and stable  Complications: No apparent anesthesia complications

## 2012-07-09 NOTE — Op Note (Signed)
[id number] 

## 2012-07-09 NOTE — Anesthesia Preprocedure Evaluation (Signed)
Anesthesia Evaluation  Patient identified by MRN, date of birth, ID band Patient awake    Reviewed: Allergy & Precautions, H&P , NPO status , Patient's Chart, lab work & pertinent test results  Airway Mallampati: I TM Distance: >3 FB Neck ROM: Full    Dental  (+) Teeth Intact and Dental Advisory Given   Pulmonary  breath sounds clear to auscultation        Cardiovascular Rhythm:Regular Rate:Normal     Neuro/Psych    GI/Hepatic GERD-  Medicated and Controlled,  Endo/Other    Renal/GU      Musculoskeletal   Abdominal   Peds  Hematology   Anesthesia Other Findings   Reproductive/Obstetrics                           Anesthesia Physical Anesthesia Plan  ASA: I  Anesthesia Plan: Bier Block and MAC   Post-op Pain Management:    Induction: Intravenous  Airway Management Planned: Simple Face Mask  Additional Equipment:   Intra-op Plan:   Post-operative Plan:   Informed Consent: I have reviewed the patients History and Physical, chart, labs and discussed the procedure including the risks, benefits and alternatives for the proposed anesthesia with the patient or authorized representative who has indicated his/her understanding and acceptance.   Dental advisory given  Plan Discussed with: CRNA, Anesthesiologist and Surgeon  Anesthesia Plan Comments:         Anesthesia Quick Evaluation

## 2012-07-09 NOTE — H&P (Signed)
  [name] [name] is an 72 y.o. female.   Chief Complaint: right long finger mass HPI: 72 yo rhd female with 6 week history of mass on right long finger.  Has broken open and drained.  Pain when bumped.  It is bothersome to her.  She wishes to have it removed.  Past Medical History  Diagnosis Date  . GERD (gastroesophageal reflux disease)   . Arthritis   . Hypothyroidism   . Diverticulitis     hx  . Wears glasses   . Fever blister     Past Surgical History  Procedure Laterality Date  . Colonoscopy    . Ercp  2007  . Finger surgery  2009    mucoid cyst lt long finger  . Joint replacement  8/13    total rt hip-[facility]  . Bunionectomy  1993    both feet  . Dilation and curettage of uterus    . Tubal ligation      History reviewed. No pertinent family history. Social History:  reports that she has never smoked. She does not have any smokeless tobacco history on file. She reports that  drinks alcohol. She reports that she does not use illicit drugs.  Allergies: No Known Allergies  Medications Prior to Admission  Medication Sig Dispense Refill  . calcium-vitamin D (OSCAL WITH D) 500-200 MG-UNIT per tablet Take 1 tablet by mouth daily.      . cholecalciferol (VITAMIN D) 1000 UNITS tablet Take 1,000 Units by mouth daily.      . fish oil-omega-3 fatty acids 1000 MG capsule Take 2 g by mouth daily.      Marland Kitchen glucosamine-chondroitin 500-400 MG tablet Take 1 tablet by mouth 2 (two) times daily before a meal.      . levothyroxine (SYNTHROID, LEVOTHROID) 75 MCG tablet Take 75 mcg by mouth daily.      Marland Kitchen omeprazole (PRILOSEC) 40 MG capsule Take 40 mg by mouth daily.      . valACYclovir (VALTREX) 500 MG tablet Take 500 mg by mouth daily. Takes 1/2        Results for orders placed during the hospital encounter of 07/09/12 (from the past 48 hour(s))  POCT HEMOGLOBIN-HEMACUE     Status: None   Collection Time    07/09/12  8:16 AM      Result Value Range   Hemoglobin 14.7  12.0 - 15.0 g/dL     No results found.   A comprehensive review of systems was negative except for: Eyes: positive for contacts/glasses  Blood pressure 140/68, pulse 60, temperature 98.2 F (36.8 C), temperature source Oral, resp. rate 16, height 5\' 6"  (1.676 m), weight 69.911 kg (154 lb 2 oz), SpO2 98.00%.  General appearance: alert, cooperative and appears stated age Head: Normocephalic, without obvious abnormality, atraumatic Neck: supple, symmetrical, trachea midline Resp: clear to auscultation bilaterally Cardio: regular rate and rhythm GI: non tender Extremities: intact sensation and capillary refill all digits.  +epl/fpl/io.  cyst distal aspect right long finger.  no erythema. Pulses: 2+ and symmetric Skin: Skin color, texture, turgor normal. No rashes or lesions Neurologic: Grossly normal Incision/Wound: na  Assessment/Plan Right long finger mucoid cyst and dip arthrosis.  Non operative and operative treatment options were discussed with the patient and patient wishes to proceed with operative treatment. Risks, benefits, and alternatives of surgery were discussed and the patient agrees with the plan of care.   Abilene Mcphee R 07/09/2012, 8:30 AM

## 2012-07-09 NOTE — Brief Op Note (Signed)
07/09/2012  9:30 AM  PATIENT:  [name] [name]  72 y.o. female  PRE-OPERATIVE DIAGNOSIS:  right long finger mucoid cyst   POST-OPERATIVE DIAGNOSIS:  right long finger mucoid cyst   PROCEDURE:  Procedure(s): RIGHT LONG FINGER EXCISION MASS AND DIP JOINT DEBRIDEMENT  (Right)  SURGEON:  Surgeon(s) and Role:    * Tami Ribas, MD - Primary    * Nicki Reaper, MD - Assisting  PHYSICIAN ASSISTANT:   ASSISTANTS: Cindee Salt, MD   ANESTHESIA:   general  EBL:  Total I/O In: 1000 [I.V.:1000] Out: -   BLOOD ADMINISTERED:none  DRAINS: none   LOCAL MEDICATIONS USED:  MARCAINE     SPECIMEN:  Source of Specimen:  right long finger  DISPOSITION OF SPECIMEN:  PATHOLOGY  COUNTS:  YES  TOURNIQUET:   Total Tourniquet Time Documented: Forearm (Right) - 21 minutes Total: Forearm (Right) - 21 minutes   DICTATION: .Other Dictation: Dictation Number [id number]  PLAN OF CARE: Discharge to home after PACU  PATIENT DISPOSITION:  PACU - hemodynamically stable.

## 2012-07-09 NOTE — Anesthesia Procedure Notes (Signed)
Procedure Name: LMA Insertion Date/Time: 07/09/2012 8:46 AM Performed by: Takia Runyon D Pre-anesthesia Checklist: Patient identified, Emergency Drugs available, Suction available and Patient being monitored Patient Re-evaluated:Patient Re-evaluated prior to inductionOxygen Delivery Method: Circle System Utilized Preoxygenation: Pre-oxygenation with 100% oxygen Intubation Type: IV induction Ventilation: Mask ventilation without difficulty LMA: LMA inserted LMA Size: 4.0 Number of attempts: 1 Airway Equipment and Method: bite block Placement Confirmation: positive ETCO2 Tube secured with: Tape Dental Injury: Teeth and Oropharynx as per pre-operative assessment

## 2012-07-10 NOTE — Op Note (Signed)
NAMELILLIS, NUTTLE                ACCOUNT NO.:  [id number]  MEDICAL RECORD NO.:  [id number]  LOCATION:                                 FACILITY:  PHYSICIAN:  Betha Loa, MD             DATE OF BIRTH:  DATE OF PROCEDURE:  07/09/2012 DATE OF DISCHARGE:                              OPERATIVE REPORT   PREOPERATIVE DIAGNOSES: 1. Right long finger mucoid cyst. 2. Distal interphalangeal joint arthrosis.  POSTOPERATIVE DIAGNOSES: 1. Right long finger mucoid cyst. 2. Distal interphalangeal joint arthrosis.  PROCEDURES: 1. Right long finger excision of mucoid cyst. 2. Debridement of distal interphalangeal joint.  SURGEON:  Betha Loa, MD  ASSISTANT:  Cindee Salt, MD  ANESTHESIA:  General.  IV FLUIDS:  Per Anesthesia flow sheet.  ESTIMATED BLOOD LOSS:  Minimal.  COMPLICATIONS:  None.  SPECIMENS:  Right long finger mucoid cyst to Pathology.  TOURNIQUET TIME:  21 minutes.  DISPOSITION:  Stable to PACU.  INDICATIONS:  Ms. Vasco is a 72 year old right-hand-dominant female who has noted a cyst on the right long finger for approximately 6 weeks.  It is bothersome to her.  It is broken, open, and leaked clear fluid in the past.  She would like it removed.  Risks, benefits, and alternatives of surgery were discussed including risk of blood loss, infection, damage to nerves, vessels, tendons, ligaments, bone, failure of surgery, need for additional surgery, complications with wound healing, continued pain, and recurrence of cyst.  She voiced understanding of these risks and elected to proceed.  OPERATIVE COURSE:  After being identified preoperatively by myself, the patient and I agreed upon the procedure and site of the procedure. Surgical site was marked.  The risks, benefits, and alternatives of surgery were reviewed and she wished to proceed.  Surgical consent had been signed.  She was given IV Ancef as preoperative antibiotic prophylaxis.  She was transported to the  operating room, placed on the operating room table in supine position with the right upper extremity on arm board.  General anesthesia was induced by anesthesiologist.  The right upper extremity was prepped and draped in normal sterile orthopedic fashion.  Surgical pause was performed between surgeons, Anesthesia, operating staff, and all were in agreement as to the patient, procedure, and site of the procedure.  Tourniquet at the proximal aspect of the extremity was inflated to 250 mmHg after exsanguination of the limb with an Esmarch bandage.  A hockey stick incision was made over the DIP joint of the right long finger.  This was carried into subcutaneous tissues by spreading technique.  The cyst was identified and excised.  It was sent to Pathology for examination.  The DIP joint was entered from the radial side underneath the extensor tendon.  It was debrided using the rongeurs and the curette.  All osteophytes that could be seen were removed.  The wound was copiously irrigated with sterile saline.  It was closed with 4-0 nylon in a horizontal mattress fashion.  Digital block was performed with 10 mL of 0.25% plain Marcaine to aid in postoperative analgesia.  The wound was then dressed with sterile Xeroform, 4x4, and  wrapped with a Coban dressing lightly. Alumafoam splint was placed and wrapped with Coban as well.  The tourniquet was deflated to 21 minutes.  Fingertips were pink with brisk capillary refill after deflation of tourniquet.  Operative drapes were broken down and the patient was awoken from anesthesia safely.  She was transferred back to the stretcher and taken to the PACU in stable condition.  I will see her back in the office in 1 week for postoperative followup.  I will give her Percocet 5/325, 1 to 2 p.o. q.6 hours p.r.n. pain, dispensed #30.     Betha Loa, MD     KK/MEDQ  D:  07/09/2012  T:  07/10/2012  Job:  [id number]

## 2012-07-23 DIAGNOSIS — Q828 Other specified congenital malformations of skin: Secondary | ICD-10-CM | POA: Diagnosis not present

## 2012-07-23 DIAGNOSIS — M79609 Pain in unspecified limb: Secondary | ICD-10-CM | POA: Diagnosis not present

## 2012-07-30 ENCOUNTER — Ambulatory Visit (HOSPITAL_BASED_OUTPATIENT_CLINIC_OR_DEPARTMENT_OTHER): Admission: RE | Admit: 2012-07-30 | Payer: Medicare Other | Source: Ambulatory Visit | Admitting: Orthopedic Surgery

## 2012-07-30 HISTORY — DX: Unspecified osteoarthritis, unspecified site: M19.90

## 2012-07-30 HISTORY — DX: Gastro-esophageal reflux disease without esophagitis: K21.9

## 2012-07-30 HISTORY — DX: Hypothyroidism, unspecified: E03.9

## 2012-07-30 HISTORY — DX: Herpesviral vesicular dermatitis: B00.1

## 2012-10-25 DIAGNOSIS — Z96649 Presence of unspecified artificial hip joint: Secondary | ICD-10-CM | POA: Diagnosis not present

## 2012-10-25 DIAGNOSIS — E782 Mixed hyperlipidemia: Secondary | ICD-10-CM | POA: Diagnosis not present

## 2012-10-25 DIAGNOSIS — Z471 Aftercare following joint replacement surgery: Secondary | ICD-10-CM | POA: Diagnosis not present

## 2012-10-25 DIAGNOSIS — M1610 Unilateral primary osteoarthritis, unspecified hip: Secondary | ICD-10-CM | POA: Diagnosis not present

## 2012-10-25 DIAGNOSIS — K219 Gastro-esophageal reflux disease without esophagitis: Secondary | ICD-10-CM | POA: Diagnosis not present

## 2012-10-30 DIAGNOSIS — Z0000 Encounter for general adult medical examination without abnormal findings: Secondary | ICD-10-CM | POA: Diagnosis not present

## 2012-10-31 DIAGNOSIS — H26499 Other secondary cataract, unspecified eye: Secondary | ICD-10-CM | POA: Diagnosis not present

## 2012-11-28 DIAGNOSIS — R141 Gas pain: Secondary | ICD-10-CM | POA: Diagnosis not present

## 2012-11-28 DIAGNOSIS — R1011 Right upper quadrant pain: Secondary | ICD-10-CM | POA: Diagnosis not present

## 2012-11-28 DIAGNOSIS — R109 Unspecified abdominal pain: Secondary | ICD-10-CM | POA: Diagnosis not present

## 2012-11-28 DIAGNOSIS — R197 Diarrhea, unspecified: Secondary | ICD-10-CM | POA: Diagnosis not present

## 2012-12-13 DIAGNOSIS — M75120 Complete rotator cuff tear or rupture of unspecified shoulder, not specified as traumatic: Secondary | ICD-10-CM | POA: Diagnosis not present

## 2012-12-18 DIAGNOSIS — M19019 Primary osteoarthritis, unspecified shoulder: Secondary | ICD-10-CM | POA: Diagnosis not present

## 2012-12-18 DIAGNOSIS — M7530 Calcific tendinitis of unspecified shoulder: Secondary | ICD-10-CM | POA: Diagnosis not present

## 2012-12-18 DIAGNOSIS — M75100 Unspecified rotator cuff tear or rupture of unspecified shoulder, not specified as traumatic: Secondary | ICD-10-CM | POA: Diagnosis not present

## 2012-12-18 DIAGNOSIS — M25419 Effusion, unspecified shoulder: Secondary | ICD-10-CM | POA: Diagnosis not present

## 2012-12-24 DIAGNOSIS — Z23 Encounter for immunization: Secondary | ICD-10-CM | POA: Diagnosis not present

## 2012-12-25 DIAGNOSIS — M75120 Complete rotator cuff tear or rupture of unspecified shoulder, not specified as traumatic: Secondary | ICD-10-CM | POA: Diagnosis not present

## 2012-12-27 DIAGNOSIS — M75120 Complete rotator cuff tear or rupture of unspecified shoulder, not specified as traumatic: Secondary | ICD-10-CM | POA: Diagnosis not present

## 2012-12-31 DIAGNOSIS — M75120 Complete rotator cuff tear or rupture of unspecified shoulder, not specified as traumatic: Secondary | ICD-10-CM | POA: Diagnosis not present

## 2013-01-02 DIAGNOSIS — M75120 Complete rotator cuff tear or rupture of unspecified shoulder, not specified as traumatic: Secondary | ICD-10-CM | POA: Diagnosis not present

## 2013-01-06 DIAGNOSIS — M75120 Complete rotator cuff tear or rupture of unspecified shoulder, not specified as traumatic: Secondary | ICD-10-CM | POA: Diagnosis not present

## 2013-02-05 DIAGNOSIS — M75120 Complete rotator cuff tear or rupture of unspecified shoulder, not specified as traumatic: Secondary | ICD-10-CM | POA: Diagnosis not present

## 2013-02-12 DIAGNOSIS — L570 Actinic keratosis: Secondary | ICD-10-CM | POA: Diagnosis not present

## 2013-02-12 DIAGNOSIS — L821 Other seborrheic keratosis: Secondary | ICD-10-CM | POA: Diagnosis not present

## 2013-02-12 DIAGNOSIS — D485 Neoplasm of uncertain behavior of skin: Secondary | ICD-10-CM | POA: Diagnosis not present

## 2013-02-12 DIAGNOSIS — L819 Disorder of pigmentation, unspecified: Secondary | ICD-10-CM | POA: Diagnosis not present

## 2013-02-14 DIAGNOSIS — M75120 Complete rotator cuff tear or rupture of unspecified shoulder, not specified as traumatic: Secondary | ICD-10-CM | POA: Diagnosis not present

## 2013-02-24 DIAGNOSIS — M75120 Complete rotator cuff tear or rupture of unspecified shoulder, not specified as traumatic: Secondary | ICD-10-CM | POA: Diagnosis not present

## 2013-03-20 DIAGNOSIS — L570 Actinic keratosis: Secondary | ICD-10-CM | POA: Diagnosis not present

## 2013-04-22 ENCOUNTER — Encounter (INDEPENDENT_AMBULATORY_CARE_PROVIDER_SITE_OTHER): Payer: Self-pay | Admitting: *Deleted

## 2013-05-01 ENCOUNTER — Ambulatory Visit (INDEPENDENT_AMBULATORY_CARE_PROVIDER_SITE_OTHER): Payer: Medicare Other | Admitting: Internal Medicine

## 2013-05-01 ENCOUNTER — Encounter (INDEPENDENT_AMBULATORY_CARE_PROVIDER_SITE_OTHER): Payer: Self-pay | Admitting: Internal Medicine

## 2013-05-01 VITALS — BP 138/82 | HR 68 | Temp 97.5°F | Ht 66.0 in | Wt 157.7 lb

## 2013-05-01 DIAGNOSIS — K219 Gastro-esophageal reflux disease without esophagitis: Secondary | ICD-10-CM | POA: Diagnosis not present

## 2013-05-01 DIAGNOSIS — R1310 Dysphagia, unspecified: Secondary | ICD-10-CM | POA: Diagnosis not present

## 2013-05-01 DIAGNOSIS — E039 Hypothyroidism, unspecified: Secondary | ICD-10-CM | POA: Insufficient documentation

## 2013-05-01 DIAGNOSIS — M1990 Unspecified osteoarthritis, unspecified site: Secondary | ICD-10-CM | POA: Insufficient documentation

## 2013-05-01 NOTE — Progress Notes (Signed)
Subjective:     Patient ID: [name] [name], female   DOB: [birth year], 73 y.o.   MRN: [id number]  HPI Referred to our office by Dr. Nicoletta Ba for dysphagia. Dysphagia for 6 months. She says she has trouble with fluids. Feels like a bubble in her esophagus.  She also c/o being hoarse.  She feels like something is blocking her upper esophagus. No trouble swallowing solids foods. Appetite is good. No weight loss.  She also c/o some acid reflux. Takes Prilosec for GERD. She tells me she stopped the Prilosec for 2 days but she started having acid reflux and went back on.  No abdominal pain.  BMs are normal. No melena or bright red rectal bleeding.     01/2006 EGD/ED: Colonoscopy: FINAL DIAGNOSIS: Small sliding hiatal hernia otherwise normal EGD.  Esophagus dilated by passing 56-French Maloney dilator given history of  dysphagia but no mucosal disruption noted in the esophagus.  External hemorrhoids felt to be source of recurrent/chronic  hematochezia.  Few small scattered diverticula at sigmoid colon. Next a small  nonbleeding cecal AVM which was left alone.      Review of Systems Past Medical History  Diagnosis Date  . GERD (gastroesophageal reflux disease)   . Arthritis   . Hypothyroidism   . Wears glasses   . Fever blister     Past Surgical History  Procedure Laterality Date  . Ercp  2007  . Finger surgery  2009    mucoid cyst lt long finger  . Joint replacement  8/13    total rt hip-[facility]  . Bunionectomy  1993    both feet  . Dilation and curettage of uterus    . Tubal ligation    . Colonoscopy    . Total hip arthroplasty      RT 2013    No Known Allergies  Current Outpatient Prescriptions on File Prior to Visit  Medication Sig Dispense Refill  . calcium-vitamin D (OSCAL WITH D) 500-200 MG-UNIT per tablet Take 1 tablet by mouth daily.      . cholecalciferol (VITAMIN D) 1000 UNITS tablet Take 1,000 Units by mouth daily.      . fish oil-omega-3 fatty acids  1000 MG capsule Take 2 g by mouth daily.      Marland Kitchen glucosamine-chondroitin 500-400 MG tablet Take 1 tablet by mouth 2 (two) times daily before a meal.      . levothyroxine (SYNTHROID, LEVOTHROID) 75 MCG tablet Take 75 mcg by mouth daily.      Marland Kitchen omeprazole (PRILOSEC) 40 MG capsule Take 40 mg by mouth daily.      . valACYclovir (VALTREX) 500 MG tablet Take 500 mg by mouth daily.        No current facility-administered medications on file prior to visit.   Married. One Child. Retired from [employer].     Objective:   Physical Exam  Filed Vitals:   05/01/13 1515  BP: 138/82  Pulse: 68  Temp: 97.5 F (36.4 C)  Height: 5\' 6"  (1.676 m)  Weight: 157 lb 11.2 oz (71.532 kg)   Alert and oriented. Skin warm and dry. Oral mucosa is moist.   . Sclera anicteric, conjunctivae is pink. Thyroid not enlarged. No cervical lymphadenopathy. Lungs clear. Heart regular rate and rhythm.  Abdomen is soft. Bowel sounds are positive. No hepatomegaly. No abdominal masses felt. No tenderness.  No edema to lower extremities.       Assessment:   Dysphagia to liquids. Symptoms x  6 months.  This is possible a motility problems.  GERD    Plan:    Barium Esophagram. Further recommendations to follow. Continue the Prilosec.

## 2013-05-01 NOTE — Patient Instructions (Signed)
Esophagram.  

## 2013-05-05 ENCOUNTER — Ambulatory Visit (HOSPITAL_COMMUNITY)
Admission: RE | Admit: 2013-05-05 | Discharge: 2013-05-05 | Disposition: A | Discharge status: Home / Self Care | Payer: Medicare Other | Source: Ambulatory Visit | Attending: Internal Medicine | Admitting: Internal Medicine

## 2013-05-05 ENCOUNTER — Other Ambulatory Visit (HOSPITAL_COMMUNITY): Payer: Medicare Other

## 2013-05-05 DIAGNOSIS — R1310 Dysphagia, unspecified: Secondary | ICD-10-CM | POA: Insufficient documentation

## 2013-05-05 DIAGNOSIS — E0590 Thyrotoxicosis, unspecified without thyrotoxic crisis or storm: Secondary | ICD-10-CM | POA: Diagnosis not present

## 2013-06-11 DIAGNOSIS — Z1231 Encounter for screening mammogram for malignant neoplasm of breast: Secondary | ICD-10-CM | POA: Diagnosis not present

## 2013-10-24 DIAGNOSIS — Z0000 Encounter for general adult medical examination without abnormal findings: Secondary | ICD-10-CM | POA: Diagnosis not present

## 2013-10-24 DIAGNOSIS — K219 Gastro-esophageal reflux disease without esophagitis: Secondary | ICD-10-CM | POA: Diagnosis not present

## 2013-10-24 DIAGNOSIS — E782 Mixed hyperlipidemia: Secondary | ICD-10-CM | POA: Diagnosis not present

## 2013-10-29 DIAGNOSIS — Z471 Aftercare following joint replacement surgery: Secondary | ICD-10-CM | POA: Diagnosis not present

## 2013-10-29 DIAGNOSIS — Z96649 Presence of unspecified artificial hip joint: Secondary | ICD-10-CM | POA: Diagnosis not present

## 2013-10-29 DIAGNOSIS — M1610 Unilateral primary osteoarthritis, unspecified hip: Secondary | ICD-10-CM | POA: Diagnosis not present

## 2013-11-10 DIAGNOSIS — K219 Gastro-esophageal reflux disease without esophagitis: Secondary | ICD-10-CM | POA: Diagnosis not present

## 2013-11-10 DIAGNOSIS — Z0000 Encounter for general adult medical examination without abnormal findings: Secondary | ICD-10-CM | POA: Diagnosis not present

## 2013-11-10 DIAGNOSIS — B009 Herpesviral infection, unspecified: Secondary | ICD-10-CM | POA: Diagnosis not present

## 2013-11-10 DIAGNOSIS — E039 Hypothyroidism, unspecified: Secondary | ICD-10-CM | POA: Diagnosis not present

## 2013-11-10 DIAGNOSIS — Z1331 Encounter for screening for depression: Secondary | ICD-10-CM | POA: Diagnosis not present

## 2014-01-01 DIAGNOSIS — Z23 Encounter for immunization: Secondary | ICD-10-CM | POA: Diagnosis not present

## 2014-02-11 DIAGNOSIS — L821 Other seborrheic keratosis: Secondary | ICD-10-CM | POA: Diagnosis not present

## 2014-02-11 DIAGNOSIS — L570 Actinic keratosis: Secondary | ICD-10-CM | POA: Diagnosis not present

## 2014-04-29 DIAGNOSIS — M25511 Pain in right shoulder: Secondary | ICD-10-CM | POA: Diagnosis not present

## 2014-04-29 DIAGNOSIS — M62421 Contracture of muscle, right upper arm: Secondary | ICD-10-CM | POA: Diagnosis not present

## 2014-04-29 DIAGNOSIS — M79621 Pain in right upper arm: Secondary | ICD-10-CM | POA: Diagnosis not present

## 2014-04-29 DIAGNOSIS — M25611 Stiffness of right shoulder, not elsewhere classified: Secondary | ICD-10-CM | POA: Diagnosis not present

## 2014-05-06 DIAGNOSIS — M62421 Contracture of muscle, right upper arm: Secondary | ICD-10-CM | POA: Diagnosis not present

## 2014-05-06 DIAGNOSIS — M79621 Pain in right upper arm: Secondary | ICD-10-CM | POA: Diagnosis not present

## 2014-05-06 DIAGNOSIS — M25511 Pain in right shoulder: Secondary | ICD-10-CM | POA: Diagnosis not present

## 2014-05-06 DIAGNOSIS — M25611 Stiffness of right shoulder, not elsewhere classified: Secondary | ICD-10-CM | POA: Diagnosis not present

## 2014-05-13 DIAGNOSIS — M25611 Stiffness of right shoulder, not elsewhere classified: Secondary | ICD-10-CM | POA: Diagnosis not present

## 2014-05-13 DIAGNOSIS — M25511 Pain in right shoulder: Secondary | ICD-10-CM | POA: Diagnosis not present

## 2014-05-13 DIAGNOSIS — M79621 Pain in right upper arm: Secondary | ICD-10-CM | POA: Diagnosis not present

## 2014-05-13 DIAGNOSIS — M62421 Contracture of muscle, right upper arm: Secondary | ICD-10-CM | POA: Diagnosis not present

## 2014-05-20 DIAGNOSIS — M79621 Pain in right upper arm: Secondary | ICD-10-CM | POA: Diagnosis not present

## 2014-05-20 DIAGNOSIS — M25611 Stiffness of right shoulder, not elsewhere classified: Secondary | ICD-10-CM | POA: Diagnosis not present

## 2014-05-20 DIAGNOSIS — M25511 Pain in right shoulder: Secondary | ICD-10-CM | POA: Diagnosis not present

## 2014-05-20 DIAGNOSIS — M62421 Contracture of muscle, right upper arm: Secondary | ICD-10-CM | POA: Diagnosis not present

## 2014-06-16 DIAGNOSIS — M7541 Impingement syndrome of right shoulder: Secondary | ICD-10-CM | POA: Diagnosis not present

## 2014-06-19 DIAGNOSIS — Z1231 Encounter for screening mammogram for malignant neoplasm of breast: Secondary | ICD-10-CM | POA: Diagnosis not present

## 2014-07-14 DIAGNOSIS — M7541 Impingement syndrome of right shoulder: Secondary | ICD-10-CM | POA: Diagnosis not present

## 2014-07-17 DIAGNOSIS — M25511 Pain in right shoulder: Secondary | ICD-10-CM | POA: Diagnosis not present

## 2014-07-17 DIAGNOSIS — M7591 Shoulder lesion, unspecified, right shoulder: Secondary | ICD-10-CM | POA: Diagnosis not present

## 2014-07-17 DIAGNOSIS — M25611 Stiffness of right shoulder, not elsewhere classified: Secondary | ICD-10-CM | POA: Diagnosis not present

## 2014-07-17 DIAGNOSIS — M19011 Primary osteoarthritis, right shoulder: Secondary | ICD-10-CM | POA: Diagnosis not present

## 2014-07-17 DIAGNOSIS — M779 Enthesopathy, unspecified: Secondary | ICD-10-CM | POA: Diagnosis not present

## 2014-07-27 DIAGNOSIS — M7541 Impingement syndrome of right shoulder: Secondary | ICD-10-CM | POA: Diagnosis not present

## 2014-07-27 DIAGNOSIS — M19011 Primary osteoarthritis, right shoulder: Secondary | ICD-10-CM | POA: Diagnosis not present

## 2014-08-24 DIAGNOSIS — M19011 Primary osteoarthritis, right shoulder: Secondary | ICD-10-CM | POA: Diagnosis not present

## 2014-09-25 DIAGNOSIS — R42 Dizziness and giddiness: Secondary | ICD-10-CM | POA: Diagnosis not present

## 2014-10-01 DIAGNOSIS — I340 Nonrheumatic mitral (valve) insufficiency: Secondary | ICD-10-CM | POA: Diagnosis not present

## 2014-10-01 DIAGNOSIS — R931 Abnormal findings on diagnostic imaging of heart and coronary circulation: Secondary | ICD-10-CM | POA: Diagnosis not present

## 2014-10-01 DIAGNOSIS — R55 Syncope and collapse: Secondary | ICD-10-CM | POA: Diagnosis not present

## 2014-11-03 DIAGNOSIS — K219 Gastro-esophageal reflux disease without esophagitis: Secondary | ICD-10-CM | POA: Diagnosis not present

## 2014-11-03 DIAGNOSIS — E782 Mixed hyperlipidemia: Secondary | ICD-10-CM | POA: Diagnosis not present

## 2014-11-03 DIAGNOSIS — E039 Hypothyroidism, unspecified: Secondary | ICD-10-CM | POA: Diagnosis not present

## 2014-11-11 DIAGNOSIS — M19011 Primary osteoarthritis, right shoulder: Secondary | ICD-10-CM | POA: Diagnosis not present

## 2014-11-19 DIAGNOSIS — Z0000 Encounter for general adult medical examination without abnormal findings: Secondary | ICD-10-CM | POA: Diagnosis not present

## 2014-12-02 DIAGNOSIS — Z8262 Family history of osteoporosis: Secondary | ICD-10-CM | POA: Diagnosis not present

## 2014-12-02 DIAGNOSIS — M85832 Other specified disorders of bone density and structure, left forearm: Secondary | ICD-10-CM | POA: Diagnosis not present

## 2014-12-02 DIAGNOSIS — Z780 Asymptomatic menopausal state: Secondary | ICD-10-CM | POA: Diagnosis not present

## 2014-12-02 DIAGNOSIS — M810 Age-related osteoporosis without current pathological fracture: Secondary | ICD-10-CM | POA: Diagnosis not present

## 2014-12-02 DIAGNOSIS — M8589 Other specified disorders of bone density and structure, multiple sites: Secondary | ICD-10-CM | POA: Diagnosis not present

## 2014-12-02 DIAGNOSIS — Z79899 Other long term (current) drug therapy: Secondary | ICD-10-CM | POA: Diagnosis not present

## 2014-12-02 DIAGNOSIS — M85852 Other specified disorders of bone density and structure, left thigh: Secondary | ICD-10-CM | POA: Diagnosis not present

## 2015-03-02 DIAGNOSIS — Z029 Encounter for administrative examinations, unspecified: Secondary | ICD-10-CM | POA: Diagnosis not present

## 2015-03-09 DIAGNOSIS — M19011 Primary osteoarthritis, right shoulder: Secondary | ICD-10-CM | POA: Diagnosis not present

## 2015-03-10 DIAGNOSIS — D239 Other benign neoplasm of skin, unspecified: Secondary | ICD-10-CM | POA: Diagnosis not present

## 2015-03-10 DIAGNOSIS — L570 Actinic keratosis: Secondary | ICD-10-CM | POA: Diagnosis not present

## 2015-03-26 IMAGING — RF DG ESOPHAGUS
16 of 24 series · 16 of 24 positions shown · non-contrast
Comparison: None

FLUOROSCOPY TIME:  1 min 36 seconds

CLINICAL DATA: Cervical dysphagia particular for liquids for
several months, past history of radioactive iodine therapy for
hyperthyroidism

EXAM:
ESOPHOGRAM / BARIUM SWALLOW / BARIUM TABLET STUDY
TECHNIQUE: Combined double contrast and single contrast examination performed
using effervescent crystals, thick barium liquid, and thin barium
liquid. The patient was observed with fluoroscopy swallowing a
mm barium sulphate tablet.

[Series 1: run · 1 of 1 slices shown (1 of 16)]
[im 1/1]
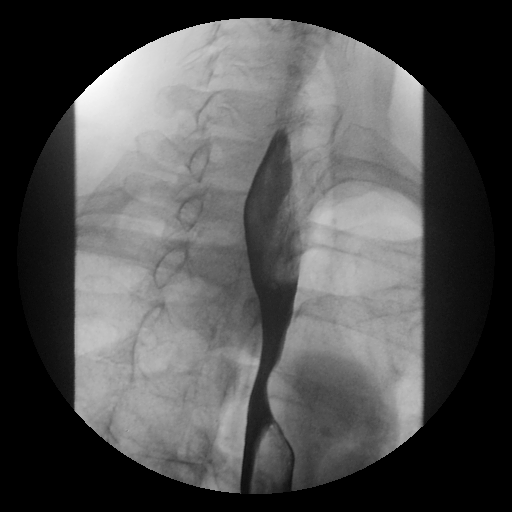

[Series 3: run · 1 of 1 slices shown (2 of 16)]
[im 1/1]
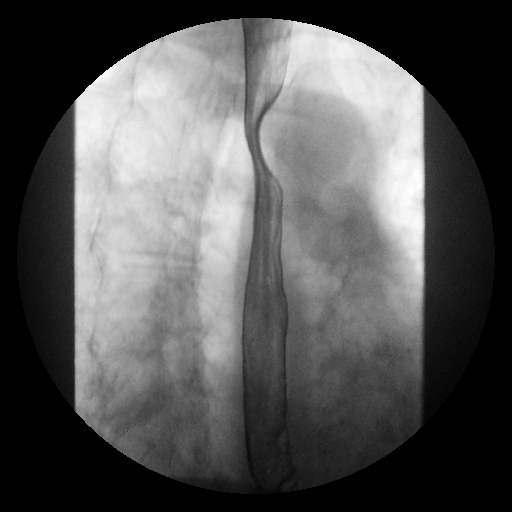

[Series 4: run · 1 of 1 slices shown (3 of 16)]
[im 1/1]
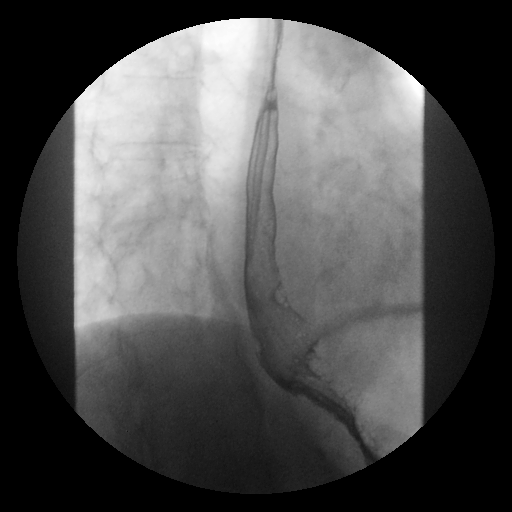

[Series 6: run · 1 of 1 slices shown (4 of 16)]
[im 1/1]
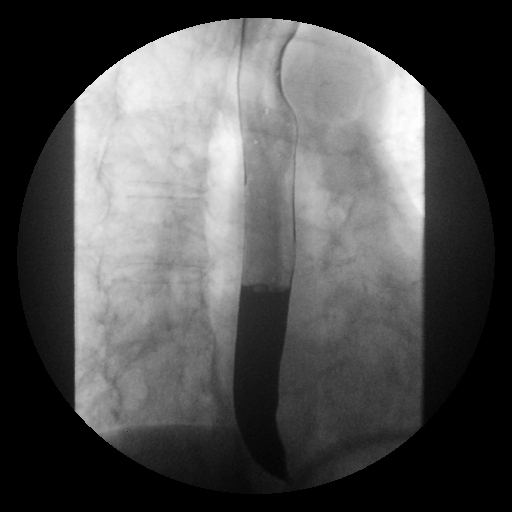

[Series 7: run · 1 of 1 slices shown (5 of 16)]
[im 1/1]
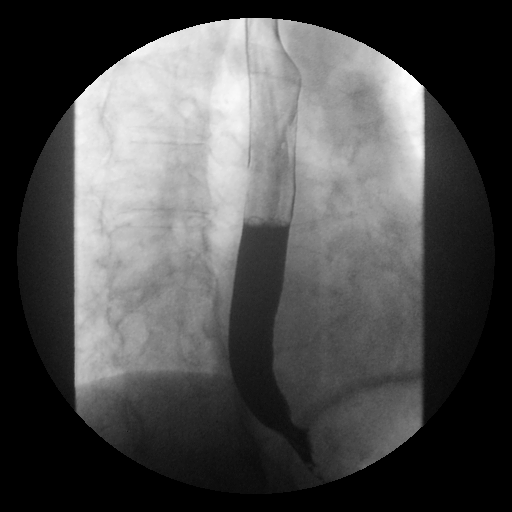

[Series 9: run · 1 of 1 slices shown (6 of 16)]
[im 1/1]
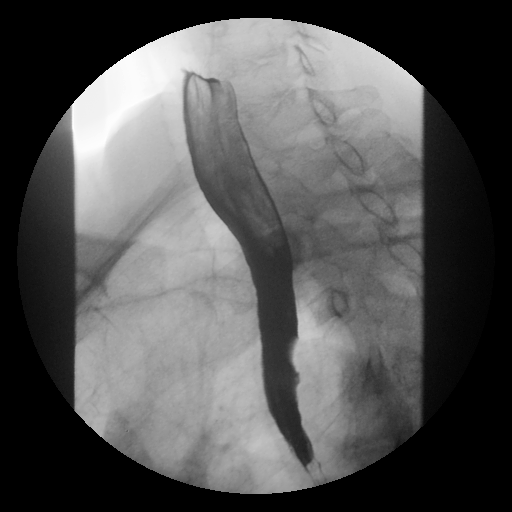

[Series 10: run · 1 of 1 slices shown (7 of 16)]
[im 1/1]
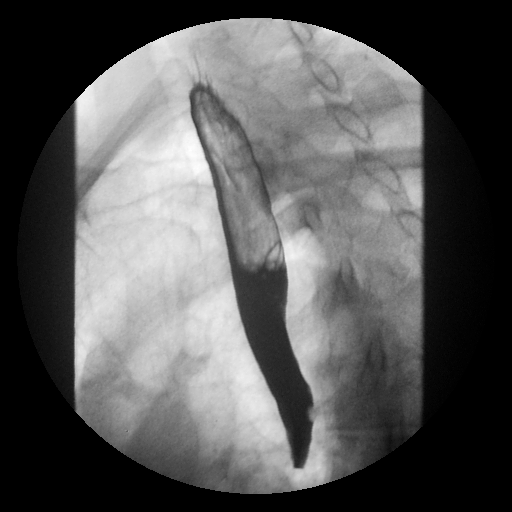

[Series 12: run · 1 of 1 slices shown (8 of 16)]
[im 1/1]
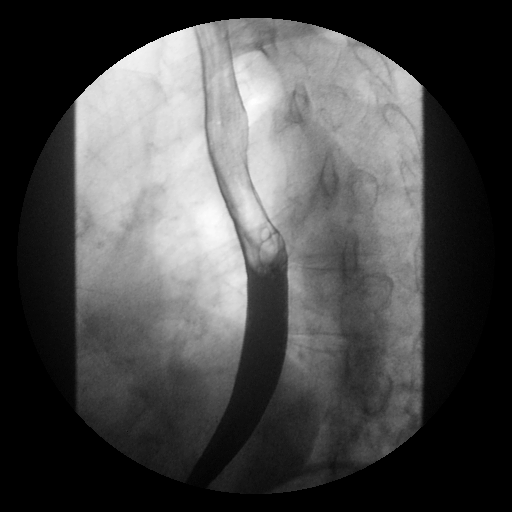

[Series 13: run · 1 of 1 slices shown (9 of 16)]
[im 1/1]
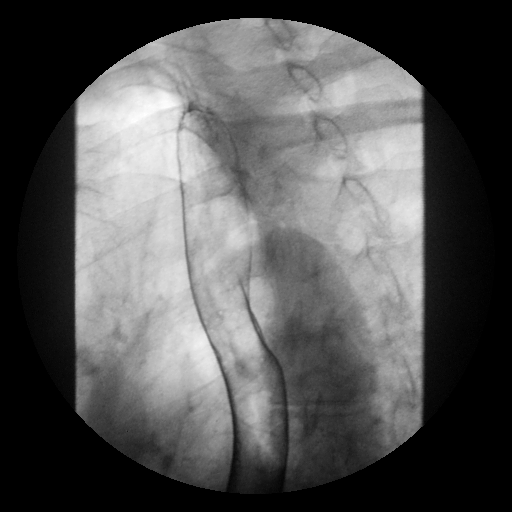

[Series 15: run · 1 of 1 slices shown (10 of 16)]
[im 1/1]
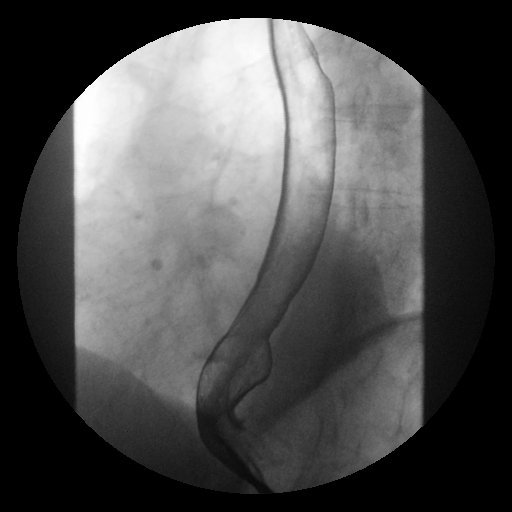

[Series 16: run · 1 of 1 slices shown (11 of 16)]
[im 1/1]
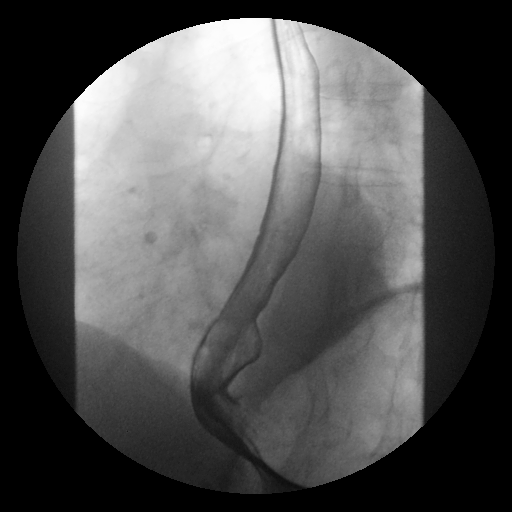

[Series 18: run · 1 of 8 slices shown (12 of 16)]
[im 1/8]
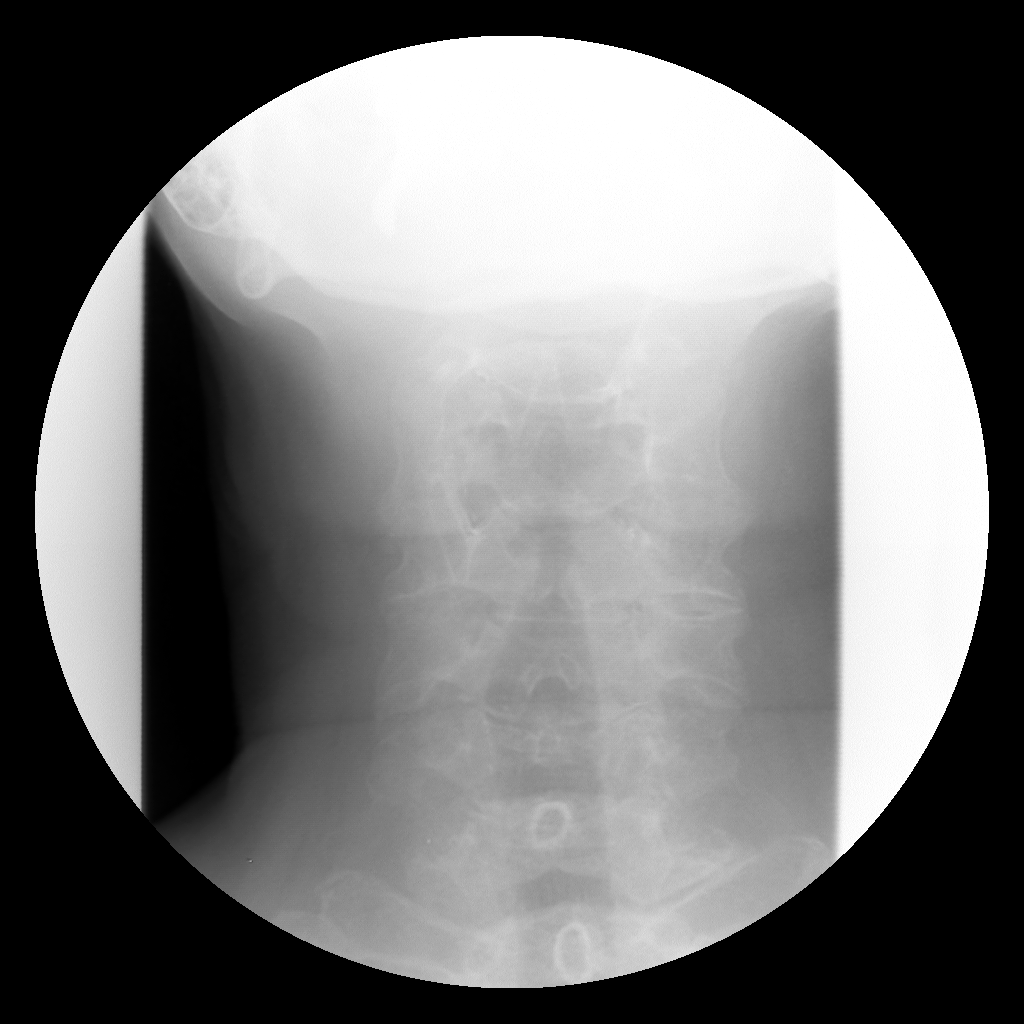

[Series 19: run · 1 of 1 slices shown (13 of 16)]
[im 1/1]
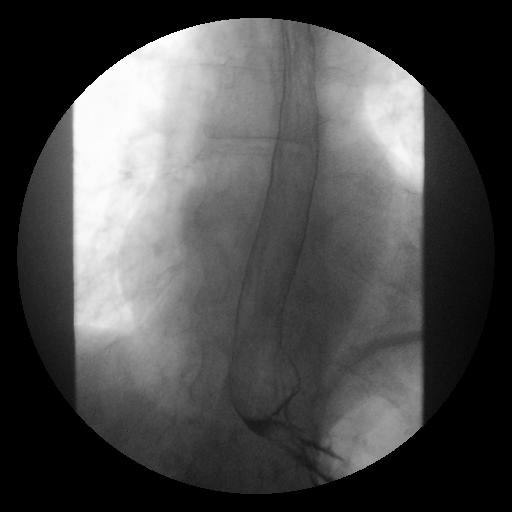

[Series 21: run · 1 of 1 slices shown (14 of 16)]
[im 1/1]
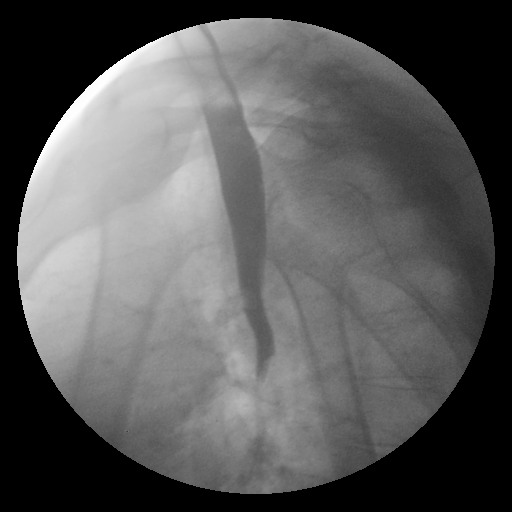

[Series 22: run · 1 of 1 slices shown (15 of 16)]
[im 1/1]
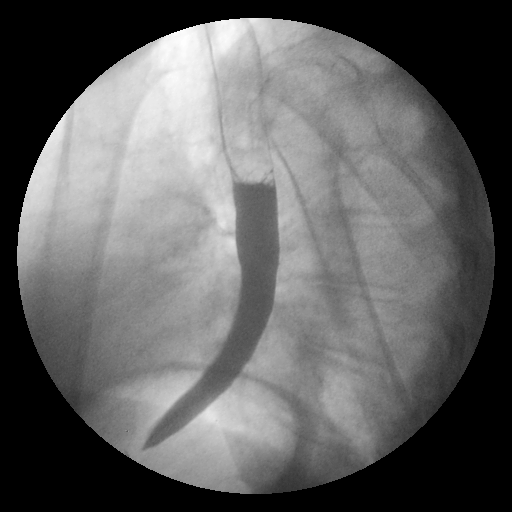

[Series 24: run · 1 of 1 slices shown (16 of 16)]
[im 1/1]
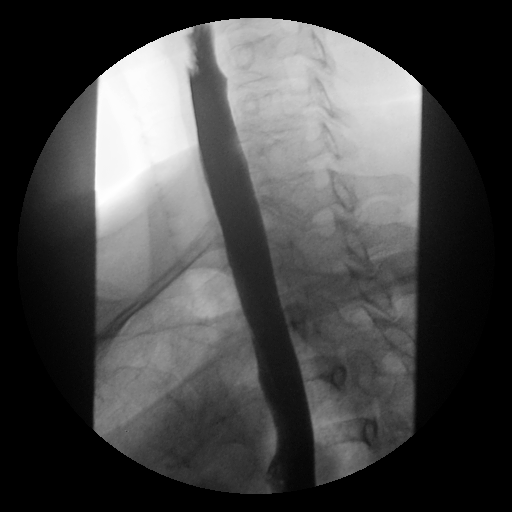

[16 of 24 positions shown; findings below may reference images not displayed]

FINDINGS: Normal esophageal distention.

No esophageal mass or stricture.

Preserved esophageal motility for age.

Could clearance of contrast by primary peristaltic waves.

12.5 mm diameter barium tablet passes from oral cavity to stomach
without obstruction.

Smooth appearance of esophageal mucosa without irregularity or
ulceration.

No persistent intraluminal filling defects.

Targeted rapid sequence imaging of the cervical esophagus and
hypopharynx shows no laryngeal penetration or aspiration.

No vallecular or piriform sinus residuals noted.
IMPRESSION: Negative esophagram.

## 2015-04-13 DIAGNOSIS — Z23 Encounter for immunization: Secondary | ICD-10-CM | POA: Diagnosis not present

## 2015-06-29 ENCOUNTER — Other Ambulatory Visit (HOSPITAL_COMMUNITY): Payer: Self-pay | Admitting: Family Medicine

## 2015-06-29 DIAGNOSIS — Z1231 Encounter for screening mammogram for malignant neoplasm of breast: Secondary | ICD-10-CM

## 2015-07-05 ENCOUNTER — Ambulatory Visit (HOSPITAL_COMMUNITY)
Admission: RE | Admit: 2015-07-05 | Discharge: 2015-07-05 | Disposition: A | Discharge status: Home / Self Care | Payer: Medicare Other | Source: Ambulatory Visit | Attending: Family Medicine | Admitting: Family Medicine

## 2015-07-05 DIAGNOSIS — Z1231 Encounter for screening mammogram for malignant neoplasm of breast: Secondary | ICD-10-CM | POA: Diagnosis present

## 2015-07-09 ENCOUNTER — Other Ambulatory Visit: Payer: Self-pay | Admitting: Family Medicine

## 2015-07-09 DIAGNOSIS — R928 Other abnormal and inconclusive findings on diagnostic imaging of breast: Secondary | ICD-10-CM

## 2015-07-19 ENCOUNTER — Other Ambulatory Visit (HOSPITAL_COMMUNITY): Payer: Self-pay | Admitting: Family Medicine

## 2015-07-19 DIAGNOSIS — R928 Other abnormal and inconclusive findings on diagnostic imaging of breast: Secondary | ICD-10-CM

## 2015-07-27 ENCOUNTER — Ambulatory Visit (HOSPITAL_COMMUNITY)
Admission: RE | Admit: 2015-07-27 | Discharge: 2015-07-27 | Disposition: A | Discharge status: Home / Self Care | Payer: Medicare Other | Source: Ambulatory Visit | Attending: Family Medicine | Admitting: Family Medicine

## 2015-07-27 DIAGNOSIS — N649 Disorder of breast, unspecified: Secondary | ICD-10-CM | POA: Insufficient documentation

## 2015-07-27 DIAGNOSIS — R928 Other abnormal and inconclusive findings on diagnostic imaging of breast: Secondary | ICD-10-CM | POA: Insufficient documentation

## 2015-08-16 DIAGNOSIS — M19011 Primary osteoarthritis, right shoulder: Secondary | ICD-10-CM | POA: Diagnosis not present

## 2015-09-21 DIAGNOSIS — M7062 Trochanteric bursitis, left hip: Secondary | ICD-10-CM | POA: Diagnosis not present

## 2015-09-21 DIAGNOSIS — M9903 Segmental and somatic dysfunction of lumbar region: Secondary | ICD-10-CM | POA: Diagnosis not present

## 2015-09-21 DIAGNOSIS — S338XXA Sprain of other parts of lumbar spine and pelvis, initial encounter: Secondary | ICD-10-CM | POA: Diagnosis not present

## 2015-09-23 DIAGNOSIS — S338XXA Sprain of other parts of lumbar spine and pelvis, initial encounter: Secondary | ICD-10-CM | POA: Diagnosis not present

## 2015-09-23 DIAGNOSIS — M9903 Segmental and somatic dysfunction of lumbar region: Secondary | ICD-10-CM | POA: Diagnosis not present

## 2015-09-23 DIAGNOSIS — M7062 Trochanteric bursitis, left hip: Secondary | ICD-10-CM | POA: Diagnosis not present

## 2015-09-27 DIAGNOSIS — M9903 Segmental and somatic dysfunction of lumbar region: Secondary | ICD-10-CM | POA: Diagnosis not present

## 2015-09-27 DIAGNOSIS — M7062 Trochanteric bursitis, left hip: Secondary | ICD-10-CM | POA: Diagnosis not present

## 2015-09-27 DIAGNOSIS — S338XXA Sprain of other parts of lumbar spine and pelvis, initial encounter: Secondary | ICD-10-CM | POA: Diagnosis not present

## 2015-09-29 DIAGNOSIS — M7062 Trochanteric bursitis, left hip: Secondary | ICD-10-CM | POA: Diagnosis not present

## 2015-09-29 DIAGNOSIS — M9903 Segmental and somatic dysfunction of lumbar region: Secondary | ICD-10-CM | POA: Diagnosis not present

## 2015-09-29 DIAGNOSIS — S338XXA Sprain of other parts of lumbar spine and pelvis, initial encounter: Secondary | ICD-10-CM | POA: Diagnosis not present

## 2015-10-04 DIAGNOSIS — M9903 Segmental and somatic dysfunction of lumbar region: Secondary | ICD-10-CM | POA: Diagnosis not present

## 2015-10-04 DIAGNOSIS — S338XXA Sprain of other parts of lumbar spine and pelvis, initial encounter: Secondary | ICD-10-CM | POA: Diagnosis not present

## 2015-10-04 DIAGNOSIS — M7062 Trochanteric bursitis, left hip: Secondary | ICD-10-CM | POA: Diagnosis not present

## 2015-11-08 ENCOUNTER — Other Ambulatory Visit: Payer: Self-pay

## 2015-11-18 DIAGNOSIS — E039 Hypothyroidism, unspecified: Secondary | ICD-10-CM | POA: Diagnosis not present

## 2015-11-18 DIAGNOSIS — K219 Gastro-esophageal reflux disease without esophagitis: Secondary | ICD-10-CM | POA: Diagnosis not present

## 2015-11-18 DIAGNOSIS — R42 Dizziness and giddiness: Secondary | ICD-10-CM | POA: Diagnosis not present

## 2015-11-18 DIAGNOSIS — E782 Mixed hyperlipidemia: Secondary | ICD-10-CM | POA: Diagnosis not present

## 2015-11-22 DIAGNOSIS — Z6826 Body mass index (BMI) 26.0-26.9, adult: Secondary | ICD-10-CM | POA: Diagnosis not present

## 2015-11-22 DIAGNOSIS — Z23 Encounter for immunization: Secondary | ICD-10-CM | POA: Diagnosis not present

## 2015-11-22 DIAGNOSIS — Z0000 Encounter for general adult medical examination without abnormal findings: Secondary | ICD-10-CM | POA: Diagnosis not present

## 2015-11-22 DIAGNOSIS — Z1389 Encounter for screening for other disorder: Secondary | ICD-10-CM | POA: Diagnosis not present

## 2015-12-06 DIAGNOSIS — H40053 Ocular hypertension, bilateral: Secondary | ICD-10-CM | POA: Diagnosis not present

## 2015-12-08 DIAGNOSIS — M19011 Primary osteoarthritis, right shoulder: Secondary | ICD-10-CM | POA: Diagnosis not present

## 2016-02-17 DIAGNOSIS — E782 Mixed hyperlipidemia: Secondary | ICD-10-CM | POA: Diagnosis not present

## 2016-02-17 DIAGNOSIS — E039 Hypothyroidism, unspecified: Secondary | ICD-10-CM | POA: Diagnosis not present

## 2016-02-17 DIAGNOSIS — K219 Gastro-esophageal reflux disease without esophagitis: Secondary | ICD-10-CM | POA: Diagnosis not present

## 2016-03-27 DIAGNOSIS — M25562 Pain in left knee: Secondary | ICD-10-CM | POA: Diagnosis not present

## 2016-03-27 DIAGNOSIS — M25561 Pain in right knee: Secondary | ICD-10-CM | POA: Diagnosis not present

## 2016-03-27 DIAGNOSIS — M1712 Unilateral primary osteoarthritis, left knee: Secondary | ICD-10-CM | POA: Diagnosis not present

## 2016-05-10 DIAGNOSIS — M19011 Primary osteoarthritis, right shoulder: Secondary | ICD-10-CM | POA: Diagnosis not present

## 2016-05-20 DIAGNOSIS — M25561 Pain in right knee: Secondary | ICD-10-CM | POA: Diagnosis not present

## 2016-05-20 DIAGNOSIS — M25562 Pain in left knee: Secondary | ICD-10-CM | POA: Diagnosis not present

## 2016-06-13 DIAGNOSIS — L570 Actinic keratosis: Secondary | ICD-10-CM | POA: Diagnosis not present

## 2016-06-13 DIAGNOSIS — D229 Melanocytic nevi, unspecified: Secondary | ICD-10-CM | POA: Diagnosis not present

## 2016-07-11 DIAGNOSIS — G3184 Mild cognitive impairment, so stated: Secondary | ICD-10-CM | POA: Diagnosis not present

## 2016-07-11 DIAGNOSIS — J069 Acute upper respiratory infection, unspecified: Secondary | ICD-10-CM | POA: Diagnosis not present

## 2016-07-11 DIAGNOSIS — R51 Headache: Secondary | ICD-10-CM | POA: Diagnosis not present

## 2016-07-11 DIAGNOSIS — E039 Hypothyroidism, unspecified: Secondary | ICD-10-CM | POA: Diagnosis not present

## 2016-07-11 DIAGNOSIS — Z6825 Body mass index (BMI) 25.0-25.9, adult: Secondary | ICD-10-CM | POA: Diagnosis not present

## 2016-07-11 DIAGNOSIS — R05 Cough: Secondary | ICD-10-CM | POA: Diagnosis not present

## 2016-07-19 DIAGNOSIS — R42 Dizziness and giddiness: Secondary | ICD-10-CM | POA: Diagnosis not present

## 2016-07-19 DIAGNOSIS — R51 Headache: Secondary | ICD-10-CM | POA: Diagnosis not present

## 2016-07-19 DIAGNOSIS — J3489 Other specified disorders of nose and nasal sinuses: Secondary | ICD-10-CM | POA: Diagnosis not present

## 2016-07-19 DIAGNOSIS — R413 Other amnesia: Secondary | ICD-10-CM | POA: Diagnosis not present

## 2016-08-30 ENCOUNTER — Other Ambulatory Visit (HOSPITAL_COMMUNITY): Payer: Self-pay | Admitting: Family Medicine

## 2016-08-30 DIAGNOSIS — Z1231 Encounter for screening mammogram for malignant neoplasm of breast: Secondary | ICD-10-CM

## 2016-09-01 ENCOUNTER — Ambulatory Visit (HOSPITAL_COMMUNITY): Payer: Medicare Other

## 2016-09-05 DIAGNOSIS — M25562 Pain in left knee: Secondary | ICD-10-CM | POA: Diagnosis not present

## 2016-09-05 DIAGNOSIS — M7050 Other bursitis of knee, unspecified knee: Secondary | ICD-10-CM | POA: Diagnosis not present

## 2016-09-06 ENCOUNTER — Ambulatory Visit (HOSPITAL_COMMUNITY)
Admission: RE | Admit: 2016-09-06 | Discharge: 2016-09-06 | Disposition: A | Discharge status: Home / Self Care | Payer: Medicare Other | Source: Ambulatory Visit | Attending: Family Medicine | Admitting: Family Medicine

## 2016-09-06 DIAGNOSIS — Z1231 Encounter for screening mammogram for malignant neoplasm of breast: Secondary | ICD-10-CM | POA: Diagnosis not present

## 2016-09-07 ENCOUNTER — Encounter (HOSPITAL_COMMUNITY): Payer: Self-pay

## 2016-09-11 DIAGNOSIS — M25462 Effusion, left knee: Secondary | ICD-10-CM | POA: Diagnosis not present

## 2016-09-11 DIAGNOSIS — S83242A Other tear of medial meniscus, current injury, left knee, initial encounter: Secondary | ICD-10-CM | POA: Diagnosis not present

## 2016-09-11 DIAGNOSIS — M71562 Other bursitis, not elsewhere classified, left knee: Secondary | ICD-10-CM | POA: Diagnosis not present

## 2016-09-11 DIAGNOSIS — M65862 Other synovitis and tenosynovitis, left lower leg: Secondary | ICD-10-CM | POA: Diagnosis not present

## 2016-09-11 DIAGNOSIS — M25562 Pain in left knee: Secondary | ICD-10-CM | POA: Diagnosis not present

## 2016-09-18 DIAGNOSIS — M1712 Unilateral primary osteoarthritis, left knee: Secondary | ICD-10-CM | POA: Diagnosis not present

## 2016-09-18 DIAGNOSIS — M7050 Other bursitis of knee, unspecified knee: Secondary | ICD-10-CM | POA: Diagnosis not present

## 2016-09-18 DIAGNOSIS — S83242D Other tear of medial meniscus, current injury, left knee, subsequent encounter: Secondary | ICD-10-CM | POA: Diagnosis not present

## 2016-09-18 DIAGNOSIS — M25562 Pain in left knee: Secondary | ICD-10-CM | POA: Diagnosis not present

## 2016-09-28 ENCOUNTER — Ambulatory Visit (INDEPENDENT_AMBULATORY_CARE_PROVIDER_SITE_OTHER): Payer: Medicare Other | Admitting: Neurology

## 2016-09-28 ENCOUNTER — Encounter: Payer: Self-pay | Admitting: Neurology

## 2016-09-28 VITALS — BP 136/81 | HR 63 | Ht 65.0 in | Wt 152.2 lb

## 2016-09-28 DIAGNOSIS — R413 Other amnesia: Secondary | ICD-10-CM

## 2016-09-28 DIAGNOSIS — Z79899 Other long term (current) drug therapy: Secondary | ICD-10-CM | POA: Diagnosis not present

## 2016-09-28 DIAGNOSIS — G3184 Mild cognitive impairment, so stated: Secondary | ICD-10-CM | POA: Diagnosis not present

## 2016-09-28 MED ORDER — FISH OIL 1200 MG PO CPDR: 1.0000 | DELAYED_RELEASE_CAPSULE | Freq: Every day | ORAL | 1 refills | Status: AC

## 2016-09-28 NOTE — Progress Notes (Signed)
[facility] [address] [place]. [place] [address] [phone]       OFFICE CONSULT NOTE  [name] [name] Date of Birth:  [birth year] Medical Record Number:  [id number]   Referring MD: Roena Malady  Reason for Referral:  Memory loss  HPI: [name] [name] is present elderly Caucasian lady seen today for initial consultation visit for memory loss. History is obtained from the patient, we have provided medical records from primary physician and imaging studies in the electronic medical records. She has been having memory difficulties for more than 6 months. She she states that this mainly involves remembering names of people whom she is known for a long time. She may has trouble at that moment may be able to number the names little while later. She also is has noticed difficulty in speaking and at times has to think about the words before she can formulate sentences and cannot speak fluently as she would like. Different things activities that she would have consider automatic are not as easy and it longer. She often gets quite frustrated with this. She has been taking fish for years but she stopped it several months ago. She is currently taking prevention. She denies any accompanying symptoms and, headaches, weakness gait or balance problems. There is no prior history of strokes, TIAs, seizures, significant head injury or loss of consciousness. She did have MRI scan of the brain done at [facility] on 07/20/26 in which I personally reviewed and appears normal for age. She has not had any lab work for reversible causes of memory loss, EEG study done. She has placed some drainage and does crossword but does not keep herself to bed with mentally challenging activities. She has no family history of Alzheimer's at her brother has cognitive issues but she thinks this may be related to exposure to pesticide while playing on a golf course several years ago.. She is independent in a total of  daily living. She denies any delusions, and reformations, agitation with significant changes in her behavior.   ROS:   14 system review of systems is positive for  memory loss, confusion and all other systems negative  PMH:  Past Medical History:  Diagnosis Date  . Arthritis   . Fever blister   . GERD (gastroesophageal reflux disease)   . Headache   . Hypothyroidism     Social History:  Social History   Social History  . Marital status: Married    Spouse name: N/A  . Number of children: N/A  . Years of education: N/A   Occupational History  . Not on file.   Social History Main Topics  . Smoking status: Never Smoker  . Smokeless tobacco: Never Used  . Alcohol use 0.6 oz/week    1 Glasses of wine per week     Comment: occ  . Drug use: No  . Sexual activity: Not on file   Other Topics Concern  . Not on file   Social History Narrative  . No narrative on file    Medications:   Current Outpatient Prescriptions on File Prior to Visit  Medication Sig Dispense Refill  . Biotin 10 MG TABS Take by mouth.    . calcium-vitamin D (OSCAL WITH D) 500-200 MG-UNIT per tablet Take 1 tablet by mouth daily.    . cholecalciferol (VITAMIN D) 1000 UNITS tablet Take 1,000 Units by mouth daily.    Marland Kitchen glucosamine-chondroitin 500-400 MG tablet Take 1 tablet by mouth 2 (two) times  daily before a meal.    . levothyroxine (SYNTHROID, LEVOTHROID) 75 MCG tablet Take 75 mcg by mouth daily.    . valACYclovir (VALTREX) 500 MG tablet Take 500 mg by mouth daily.      No current facility-administered medications on file prior to visit.     Allergies:   Allergies  Allergen Reactions  . Other Other (See Comments)    Unknown Anesthesia=vomiting    Physical Exam General: well developed, well nourished elderly Caucasian lady, seated, in no evident distress Head: head normocephalic and atraumatic.   Neck: supple with no carotid or supraclavicular bruits Cardiovascular: regular rate and rhythm,  no murmurs Musculoskeletal: no deformity Skin:  no rash/petichiae Vascular:  Normal pulses all extremities  Neurologic Exam Mental Status: Awake and fully alert. Oriented to place and time. Recent and remote memory intact. Attention span, concentration and fund of knowledge appropriate. Mood and affect appropriate. Mini-Mental status exam scored 28/30 with 2 deficits in recall. Able to name 9 animals with full legs. Clock drawing 3/4. Able to copy intersecting pentagons. Cranial Nerves: Fundoscopic exam reveals sharp disc margins. Pupils equal, briskly reactive to light. Extraocular movements full without nystagmus. Visual fields full to confrontation. Hearing intact. Facial sensation intact. Face, tongue, palate moves normally and symmetrically.  Motor: Normal bulk and tone. Normal strength in all tested extremity muscles. Sensory.: intact to touch , pinprick , position and vibratory sensation.  Coordination: Rapid alternating movements normal in all extremities. Finger-to-nose and heel-to-shin performed accurately bilaterally. Gait and Station: Arises from chair without difficulty. Stance is normal. Gait demonstrates normal stride length and balance . Able to heel, toe and tandem walk without difficulty.  Reflexes: 1+ and symmetric. Toes downgoing.       ASSESSMENT: 50 year Caucasian lady with mild memory and cognitive difficulties secondary to mild cognitive impairment.    PLAN: I had a long discussion with the patient with regards to her mild memory difficulties and cognitive impairment. I recommend she increase participation in cognitively challenging activities like solving crossword puzzles, playing bridge, sudoku. Continue present regimen daily and add fish oil 1200 mg daily. Check lab work for reversible causes of memory loss and EEG. Consider possible participation in the [place] clinical trial if interested. Greater than 50% time during this 45 minute consultation visit was  spent on counseling and coordination of care about her memory loss and mild cognitive impairment, discussion of treatment and evaluation plan and answering questions. She will return for follow-up in 2 months or call earlier if necessary. Antony Contras, MD  [facility] [address] [place], [place] [address]  Phone [phone] Fax [phone] Note: This document was prepared with digital dictation and possible smart phrase technology. Any transcriptional errors that result from this process are unintentional.

## 2016-09-28 NOTE — Progress Notes (Signed)
Rn gave patient lab requisition to be done at her PCP. Pt has to fast for her lab work. Rn also gave fax number at [facility] to have labs fax to Dr. Leonie Man.

## 2016-09-28 NOTE — Patient Instructions (Signed)
I had a long discussion with the patient with regards to her mild memory difficulties and cognitive impairment. I recommend she increase participation in cognitively challenging activities like solving crossword puzzles, playing bridge, sudoku. Continue present regimen daily and add fish oil 1200 mg daily. Check lab work for reversible causes of memory loss and EEG. Consider possible participation in the [place] clinical trial if interested. She will return for follow-up in 2 months or call earlier if necessary. Mild Neurocognitive Disorder Mild neurocognitive disorder (formerly known as mild cognitive impairment) is a mental disorder. It is a slight abnormal decrease in mental function. The areas of mental function affected may include memory, thought, communication, behavior, and completion of tasks. The decrease is noticeable and measurable but for the most part does not interfere with your daily activities. Mild neurocognitive disorder typically occurs in people older than 60 years but can occur earlier. It is not as serious as major neurocognitive disorder (formerly known as dementia) but may lead to a more serious neurocognitive disorder. However, in some cases the condition does not get worse. A few people with this disorder even improve. What are the causes? There are a number of different causes of mild neurocognitive disorder:  Brain disorders associated with abnormal protein deposits, such as Alzheimer's disease, Pick's disease, and Lewy body disease.  Brain disorders associated with abnormal movement, such as Parkinson's disease and Huntington's disease.  Diseases affecting blood vessels in the brain and resulting in mini-strokes.  Certain infections, such as human immunodeficiency virus (HIV) infection.  Traumatic brain injury.  Other medical conditions such as brain tumors, underactive thyroid (hypothyroidism), and vitamin B12 deficiency.  Use of certain prescription medicine  and "recreational" drugs.  What are the signs or symptoms? Symptoms of mild neurocognitive disorder include:  Difficulty remembering. You may forget details of recent events, names, or phone numbers. You may forget important social events and appointments or repeatedly forget where you put your car keys.  Difficulty thinking and solving problems. You may have trouble with complex tasks such as paying bills or driving in unfamiliar locations.  Difficulty communicating. You may have trouble finding the right word, naming an object, forming a sentence that makes sense, or understanding what you read or hear.  Changes in your behavior or personality. You may lose interest in the things that you used to enjoy or withdraw from social situations. You may get angry more easily than usual. You may act before thinking. You may do things in public that you would not usually do. You may hear or see things that are not real (hallucinations). You may believe falsely that others are trying to hurt you (paranoia).  How is this diagnosed? Mild neurocognitive disorder is diagnosed through an assessment by your health care provider. Your health care provider will ask you and your family, friends, or coworkers questions about your symptoms. He or she will ask how often the symptoms occur, how long they have been occurring, whether they are getting worse, and the effect they are having on your life. Your health care provider may refer you to a neurologist or mental health specialist for a detailed evaluation of your mental functions (neuropsychological testing). To identify the cause of your mild neurocognitive disorder, your health care provider may:  Obtain a detailed medical history.  Ask about alcohol and drug use, including prescription medicine.  Perform a physical exam.  Order blood tests and brain imaging exams.  How is this treated? Mild neurocognitive disorder caused by infections,  use of certain  medicines or "recreational" drugs, and certain medical conditions may improve with treatment of the condition that is causing the disorder. Mild neurocognitive disorder resulting from other causes generally does not improve and may worsen. In these cases, the goal of treatment is to slow progression of the disorder and help you cope with the loss of mental function. Treatments in these cases include:  Medicine. Medicine helps mainly with memory loss and behavioral symptoms.  Talk therapy. Talk therapy provides education, emotional support, memory aids, and other ways of making up for decreases in mental function.  Lifestyle changes. These include regular exercise, a healthy diet (including essential omega-3 fatty acids), intellectual stimulation, and increased social interaction.  This information is not intended to replace advice given to you by your health care provider. Make sure you discuss any questions you have with your health care provider. Document Released: 10/30/2012 Document Revised: 08/05/2015 Document Reviewed: 07/22/2012 Elsevier Interactive Patient Education  2017 [employer].

## 2016-09-29 LAB — DEMENTIA PANEL
HOMOCYSTEINE: 8.6 umol/L (ref 0.0–15.0)
RPR Ser Ql: NONREACTIVE
TSH: 0.904 u[IU]/mL (ref 0.450–4.500)

## 2016-10-09 ENCOUNTER — Telehealth: Payer: Self-pay

## 2016-10-09 NOTE — Telephone Encounter (Signed)
-----   Message from Garvin Fila, MD sent at 10/06/2016  5:40 PM EDT ----- [name] [name] let the patient know that lab work for reversible causes of memory loss was normal

## 2016-10-09 NOTE — Telephone Encounter (Signed)
RN call patient that her lab work dementia panel was normal. Pt verbalized understanding.

## 2016-10-16 ENCOUNTER — Ambulatory Visit (INDEPENDENT_AMBULATORY_CARE_PROVIDER_SITE_OTHER): Payer: Medicare Other

## 2016-10-16 DIAGNOSIS — R410 Disorientation, unspecified: Secondary | ICD-10-CM | POA: Diagnosis not present

## 2016-10-16 DIAGNOSIS — G3184 Mild cognitive impairment, so stated: Secondary | ICD-10-CM

## 2016-10-23 ENCOUNTER — Telehealth: Payer: Self-pay | Admitting: Neurology

## 2016-10-23 NOTE — Telephone Encounter (Signed)
Pt calling for results of the EEG, she was made aware Dr Leonie Man is not in office this week but that once results were available she would be contacted.  Pt is asking for a call back from Dr Clydene Fake RN re: her medication

## 2016-10-23 NOTE — Telephone Encounter (Signed)
Rn call patient about her EEG. Rn stated the EEG was normal. Pt verbalized understanding. Rn stated in Dr. Leonie Man note he only mention the research study, not prescribing her any aricept, or namenda medication. Pt wants to know if he still wants her on the medication, or just the study. Pt will wait until he returns next week.

## 2016-10-31 NOTE — Telephone Encounter (Signed)
I spoke to the patient and discussed options of trying Aricept versus participation in the [place] [facility] early dementia study. After discussion she choses to hold off on Aricept and prior the study first if she qualifies. I will forward her name to this process study coordinator to contact her

## 2016-12-04 ENCOUNTER — Ambulatory Visit (INDEPENDENT_AMBULATORY_CARE_PROVIDER_SITE_OTHER): Payer: Medicare Other | Admitting: Neurology

## 2016-12-04 ENCOUNTER — Encounter: Payer: Self-pay | Admitting: Neurology

## 2016-12-04 VITALS — BP 127/70 | HR 62 | Ht 65.0 in | Wt 154.4 lb

## 2016-12-04 DIAGNOSIS — G3184 Mild cognitive impairment, so stated: Secondary | ICD-10-CM

## 2016-12-04 NOTE — Patient Instructions (Signed)
I had a long discussion with the patient and her husband with regards to her mild memory difficulties and cognitive impairment. I recommend she continue  participation in cognitively challenging activities like solving crossword puzzles, playing bridge, sudoku. Continue present regimen and  fish oil 1200 mg daily. She is participating in the [employer] early dementia trial but it is temporarily on hold due to a protocol change. He also discussed memory compensation strategies. She will return for follow-up in 6 months or call earlier if necessary Memory Compensation Strategies  1. Use "WARM" strategy.  W= write it down  A= associate it  R= repeat it  M= make a mental note  2.   You can keep a Social worker.  Use a 3-ring notebook with sections for the following: calendar, important names and phone numbers,  medications, doctors' names/phone numbers, lists/reminders, and a section to journal what you did  each day.   3.    Use a calendar to write appointments down.  4.    Write yourself a schedule for the day.  This can be placed on the calendar or in a separate section of the Memory Notebook.  Keeping a  regular schedule can help memory.  5.    Use medication organizer with sections for each day or morning/evening pills.  You may need help loading it  6.    Keep a basket, or pegboard by the door.  Place items that you need to take out with you in the basket or on the pegboard.  You may also want to  include a message board for reminders.  7.    Use sticky notes.  Place sticky notes with reminders in a place where the task is performed.  For example: " turn off the  stove" placed by the stove, "lock the door" placed on the door at eye level, " take your medications" on  the bathroom mirror or by the place where you normally take your medications.  8.    Use alarms/timers.  Use while cooking to remind yourself to check on food or as a reminder to take your medicine, or as a  reminder to  make a call, or as a reminder to perform another task, etc.

## 2016-12-04 NOTE — Progress Notes (Signed)
[facility] [address] [place]. [place] [address] [phone]       OFFICE FOLLOW UP VISIT NOTE  [name]. [name] [name] Date of Birth:  [birth year] Medical Record Number:  [id number]   Referring MD: Roena Malady  Reason for Referral:  Memory loss  HPI: Initial Consult 09/25/2016 ; [name] [name] is present elderly Caucasian lady seen today for initial consultation visit for memory loss. History is obtained from the patient, we have provided medical records from primary physician and imaging studies in the electronic medical records. She has been having memory difficulties for more than 6 months. She she states that this mainly involves remembering names of people whom she is known for a long time. She may has trouble at that moment may be able to number the names little while later. She also is has noticed difficulty in speaking and at times has to think about the words before she can formulate sentences and cannot speak fluently as she would like. Different things activities that she would have consider automatic are not as easy and it longer. She often gets quite frustrated with this. She has been taking fish for years but she stopped it several months ago. She is currently taking prevention. She denies any accompanying symptoms and, headaches, weakness gait or balance problems. There is no prior history of strokes, TIAs, seizures, significant head injury or loss of consciousness. She did have MRI scan of the brain done at [facility] on 07/20/26 in which I personally reviewed and appears normal for age. She has not had any lab work for reversible causes of memory loss, EEG study done. She has placed some drainage and does crossword but does not keep herself to bed with mentally challenging activities. She has no family history of Alzheimer's at her brother has cognitive issues but she thinks this may be related to exposure to pesticide while playing on a golf course several years  ago.. She is independent in a total of daily living. She denies any delusions, and reformations, agitation with significant changes in her behavior.  Update 12/04/2016 ; she returns for follow-up of after her last visit 2 months ago. She states her short-term memory difficulties about unchanged. She does participate in doing sudoku every day as well as bridge is many times as she can. She had dementia panel labs checked at last visit all of which were normal. She has no neurological complaints. She did try out for the [employer] dementia study and passed the initial memory screen but the study is temporarily on hold due to protocol change. She has had no new health problems. She has started taking fish oil every day is tolerating it well. ROS:   14 system review of systems is positive for  memory loss,only and all other systems negative  PMH:  Past Medical History:  Diagnosis Date  . Arthritis   . Fever blister   . GERD (gastroesophageal reflux disease)   . Headache   . Hypothyroidism     Social History:  Social History   Social History  . Marital status: Married    Spouse name: N/A  . Number of children: N/A  . Years of education: N/A   Occupational History  . Not on file.   Social History Main Topics  . Smoking status: Never Smoker  . Smokeless tobacco: Never Used  . Alcohol use 0.6 oz/week    1 Glasses of wine per week     Comment: occ  .  Drug use: No  . Sexual activity: Not on file   Other Topics Concern  . Not on file   Social History Narrative  . No narrative on file    Medications:   Current Outpatient Prescriptions on File Prior to Visit  Medication Sig Dispense Refill  . acetaminophen (TYLENOL) 500 MG tablet Take 1,000 mg by mouth.    . Apoaequorin (PREVAGEN EXTRA STRENGTH PO) Take by mouth.    . Biotin 10 MG TABS Take by mouth.    . calcium-vitamin D (OSCAL WITH D) 500-200 MG-UNIT per tablet Take 1 tablet by mouth daily.    Marland Kitchen glucosamine-chondroitin  500-400 MG tablet Take 1 tablet by mouth 2 (two) times daily before a meal.    . levothyroxine (SYNTHROID, LEVOTHROID) 75 MCG tablet Take 75 mcg by mouth daily.    . Omega-3 Fatty Acids (FISH OIL) 1200 MG CPDR Take 1 capsule by mouth daily. 60 capsule 1  . valACYclovir (VALTREX) 500 MG tablet Take 500 mg by mouth daily.      No current facility-administered medications on file prior to visit.     Allergies:   Allergies  Allergen Reactions  . Other Other (See Comments)    Unknown Anesthesia=vomiting    Physical Exam General: well developed, well nourished elderly Caucasian lady, seated, in no evident distress Head: head normocephalic and atraumatic.   Neck: supple with no carotid or supraclavicular bruits Cardiovascular: regular rate and rhythm, no murmurs Musculoskeletal: no deformity Skin:  no rash/petichiae Vascular:  Normal pulses all extremities  Neurologic Exam Mental Status: Awake and fully alert. Oriented to place and time. Recent and remote memory intact. Attention span, concentration and fund of knowledge appropriate. Mood and affect appropriate. Mini-Mental status exam not donebut recall today 3/3.Marland Kitchen Able to name only 6 animals with 4l legs. Clock drawing 3/4. Able to copy intersecting pentagons. Cranial Nerves: Fundoscopic exam reveals sharp disc margins. Pupils equal, briskly reactive to light. Extraocular movements full without nystagmus. Visual fields full to confrontation. Hearing intact. Facial sensation intact. Face, tongue, palate moves normally and symmetrically.  Motor: Normal bulk and tone. Normal strength in all tested extremity muscles. Sensory.: intact to touch , pinprick , position and vibratory sensation.  Coordination: Rapid alternating movements normal in all extremities. Finger-to-nose and heel-to-shin performed accurately bilaterally. Gait and Station: Arises from chair without difficulty. Stance is normal. Gait demonstrates normal stride length and balance  . Able to heel, toe and tandem walk without difficulty.  Reflexes: 1+ and symmetric. Toes downgoing.       ASSESSMENT: 52 year Caucasian lady with mild memory and cognitive difficulties secondary to mild cognitive impairment.    PLAN: I had a long discussion with the patient with regards to her mild memory difficulties and cognitive impairment. I recommend she increase participation in cognitively challenging activities like solving crossword puzzles, playing bridge, sudoku. Continue present regimen daily and add fish oil 1200 mg daily continue participation in the [employer] early dementia study if she qualifies. Greater than 50% time during this 25 minute  visit was spent on counseling and coordination of care about her memory loss and mild cognitive impairment, discussion of treatment and evaluation plan and answering questions. She will return for follow-up in  49months or call earlier if necessary. Antony Contras, MD  [facility] [address] [place], [place] [address]  Phone [phone] Fax [phone] Note: This document was prepared with digital dictation and possible smart phrase technology. Any transcriptional errors that result  from this process are unintentional.

## 2016-12-19 DIAGNOSIS — E039 Hypothyroidism, unspecified: Secondary | ICD-10-CM | POA: Diagnosis not present

## 2016-12-19 DIAGNOSIS — E782 Mixed hyperlipidemia: Secondary | ICD-10-CM | POA: Diagnosis not present

## 2016-12-19 DIAGNOSIS — K219 Gastro-esophageal reflux disease without esophagitis: Secondary | ICD-10-CM | POA: Diagnosis not present

## 2016-12-21 DIAGNOSIS — Z0000 Encounter for general adult medical examination without abnormal findings: Secondary | ICD-10-CM | POA: Diagnosis not present

## 2016-12-21 DIAGNOSIS — Z6825 Body mass index (BMI) 25.0-25.9, adult: Secondary | ICD-10-CM | POA: Diagnosis not present

## 2016-12-21 DIAGNOSIS — Z1389 Encounter for screening for other disorder: Secondary | ICD-10-CM | POA: Diagnosis not present

## 2016-12-21 DIAGNOSIS — E039 Hypothyroidism, unspecified: Secondary | ICD-10-CM | POA: Diagnosis not present

## 2016-12-21 DIAGNOSIS — G3184 Mild cognitive impairment, so stated: Secondary | ICD-10-CM | POA: Diagnosis not present

## 2016-12-21 DIAGNOSIS — Z23 Encounter for immunization: Secondary | ICD-10-CM | POA: Diagnosis not present

## 2017-01-03 DIAGNOSIS — M1712 Unilateral primary osteoarthritis, left knee: Secondary | ICD-10-CM | POA: Diagnosis not present

## 2017-01-03 DIAGNOSIS — S83232D Complex tear of medial meniscus, current injury, left knee, subsequent encounter: Secondary | ICD-10-CM | POA: Diagnosis not present

## 2017-01-18 DIAGNOSIS — J019 Acute sinusitis, unspecified: Secondary | ICD-10-CM | POA: Diagnosis not present

## 2017-01-18 DIAGNOSIS — Z6825 Body mass index (BMI) 25.0-25.9, adult: Secondary | ICD-10-CM | POA: Diagnosis not present

## 2017-01-19 DIAGNOSIS — B351 Tinea unguium: Secondary | ICD-10-CM | POA: Diagnosis not present

## 2017-01-19 DIAGNOSIS — L03031 Cellulitis of right toe: Secondary | ICD-10-CM | POA: Diagnosis not present

## 2017-02-02 DIAGNOSIS — Z6825 Body mass index (BMI) 25.0-25.9, adult: Secondary | ICD-10-CM | POA: Diagnosis not present

## 2017-02-02 DIAGNOSIS — J209 Acute bronchitis, unspecified: Secondary | ICD-10-CM | POA: Diagnosis not present

## 2017-02-02 DIAGNOSIS — J019 Acute sinusitis, unspecified: Secondary | ICD-10-CM | POA: Diagnosis not present

## 2017-02-08 DIAGNOSIS — Z538 Procedure and treatment not carried out for other reasons: Secondary | ICD-10-CM | POA: Diagnosis not present

## 2017-02-08 DIAGNOSIS — S83242A Other tear of medial meniscus, current injury, left knee, initial encounter: Secondary | ICD-10-CM | POA: Diagnosis not present

## 2017-04-03 DIAGNOSIS — R05 Cough: Secondary | ICD-10-CM | POA: Diagnosis not present

## 2017-04-03 DIAGNOSIS — J019 Acute sinusitis, unspecified: Secondary | ICD-10-CM | POA: Diagnosis not present

## 2017-04-03 DIAGNOSIS — Z6824 Body mass index (BMI) 24.0-24.9, adult: Secondary | ICD-10-CM | POA: Diagnosis not present

## 2017-04-05 ENCOUNTER — Other Ambulatory Visit: Payer: Self-pay | Admitting: Neurology

## 2017-04-05 DIAGNOSIS — F0280 Dementia in other diseases classified elsewhere without behavioral disturbance: Secondary | ICD-10-CM

## 2017-04-05 DIAGNOSIS — G309 Alzheimer's disease, unspecified: Principal | ICD-10-CM

## 2017-04-07 ENCOUNTER — Other Ambulatory Visit: Payer: Self-pay | Admitting: Neurology

## 2017-04-07 DIAGNOSIS — R413 Other amnesia: Secondary | ICD-10-CM

## 2017-04-10 ENCOUNTER — Ambulatory Visit
Admission: RE | Admit: 2017-04-10 | Discharge: 2017-04-10 | Disposition: A | Discharge status: Home / Self Care | Payer: No Typology Code available for payment source | Source: Ambulatory Visit | Attending: Neurology | Admitting: Neurology

## 2017-04-10 DIAGNOSIS — R413 Other amnesia: Secondary | ICD-10-CM

## 2017-06-04 ENCOUNTER — Encounter: Payer: Self-pay | Admitting: Adult Health

## 2017-06-04 ENCOUNTER — Ambulatory Visit (INDEPENDENT_AMBULATORY_CARE_PROVIDER_SITE_OTHER): Payer: Medicare Other | Admitting: Adult Health

## 2017-06-04 VITALS — BP 117/74 | HR 60 | Wt 151.0 lb

## 2017-06-04 DIAGNOSIS — R413 Other amnesia: Secondary | ICD-10-CM | POA: Diagnosis not present

## 2017-06-04 NOTE — Progress Notes (Signed)
I agree with the above plan 

## 2017-06-04 NOTE — Progress Notes (Signed)
[facility] [address] [place]. [place] [address] [phone]       OFFICE FOLLOW UP VISIT NOTE  [name]. [name] [name] Date of Birth:  March 22, 1940 Medical Record Number:  [id number]   Referring MD: Roena Malady  Reason for Referral:  Memory loss  HPI: Initial Consult 09/25/2016 (SP): [name] [name] is a pleasant 77 yo elderly Caucasian lady seen today for initial consultation visit for memory loss. History is obtained from the patient, we have provided medical records from primary physician and imaging studies in the electronic medical records. She has been having memory difficulties for more than 6 months. She she states that this mainly involves remembering names of people whom she is known for a long time. She may has trouble at that moment may be able to number the names little while later. She also is has noticed difficulty in speaking and at times has to think about the words before she can formulate sentences and cannot speak fluently as she would like. Different things activities that she would have consider automatic are not as easy and it longer. She often gets quite frustrated with this. She has been taking fish for years but she stopped it several months ago. She is currently taking prevention. She denies any accompanying symptoms and, headaches, weakness gait or balance problems. There is no prior history of strokes, TIAs, seizures, significant head injury or loss of consciousness. She did have MRI scan of the brain done at [facility] on 07/20/26 in which I personally reviewed and appears normal for age. She has not had any lab work for reversible causes of memory loss, EEG study done. She has placed some drainage and does crossword but does not keep herself to bed with mentally challenging activities. She has no family history of Alzheimer's at her brother has cognitive issues but she thinks this may be related to exposure to pesticide while playing on a golf course  several years ago.. She is independent in a total of daily living. She denies any delusions, and reformations, agitation with significant changes in her behavior.   Update 12/04/2016 (SP): she returns for follow-up of after her last visit 2 months ago. She states her short-term memory difficulties about unchanged. She does participate in doing sudoku every day as well as bridge is many times as she can. She had dementia panel labs checked at last visit all of which were normal. She has no neurological complaints. She did try out for the [employer] dementia study and passed the initial memory screen but the study is temporarily on hold due to protocol change. She has had no new health problems. She has started taking fish oil every day is tolerating it well.  UPDATE 06/04/17: Patient is being seen today for six-month follow-up.  She has recently enrolled in the TRAILBLAZER she was inquiring about trial in which she recently had her first appointment last week.  She feels as though her memory has worsened since previous visit 6 months ago.  Does continue to take fish oil 1200 mg daily and Apoaequorin daily.  She does feel as though she does have good days and bad days.  Possibly wanting to be on Aricept or Namenda in the future if she is not seeing improvement with this trial.    ROS:   14 system review of systems is positive for cough, memory loss, itching, and confusion only and all other systems negative  PMH:  Past Medical History:  Diagnosis Date  .  Arthritis   . Fever blister   . GERD (gastroesophageal reflux disease)   . Headache   . Hypothyroidism     Social History:  Social History   Socioeconomic History  . Marital status: Married    Spouse name: Not on file  . Number of children: Not on file  . Years of education: Not on file  . Highest education level: Not on file  Occupational History  . Not on file  Social Needs  . Financial resource strain: Not on file  . Food  insecurity:    Worry: Not on file    Inability: Not on file  . Transportation needs:    Medical: Not on file    Non-medical: Not on file  Tobacco Use  . Smoking status: Never Smoker  . Smokeless tobacco: Never Used  Substance and Sexual Activity  . Alcohol use: Yes    Alcohol/week: 0.6 oz    Types: 1 Glasses of wine per week    Comment: occ  . Drug use: No  . Sexual activity: Not on file  Lifestyle  . Physical activity:    Days per week: Not on file    Minutes per session: Not on file  . Stress: Not on file  Relationships  . Social connections:    Talks on phone: Not on file    Gets together: Not on file    Attends religious service: Not on file    Active member of club or organization: Not on file    Attends meetings of clubs or organizations: Not on file    Relationship status: Not on file  . Intimate partner violence:    Fear of current or ex partner: Not on file    Emotionally abused: Not on file    Physically abused: Not on file    Forced sexual activity: Not on file  Other Topics Concern  . Not on file  Social History Narrative  . Not on file    Medications:   Current Outpatient Medications on File Prior to Visit  Medication Sig Dispense Refill  . acetaminophen (TYLENOL) 500 MG tablet Take 1,000 mg by mouth.    . Apoaequorin (PREVAGEN EXTRA STRENGTH PO) Take by mouth.    . Biotin 10 MG TABS Take by mouth.    . calcium-vitamin D (OSCAL WITH D) 500-200 MG-UNIT per tablet Take 1 tablet by mouth daily.    . Cholecalciferol (VITAMIN D3) 1000 units CAPS Take 1,000 Units by mouth daily.    . DOCOSAHEXAENOIC ACID PO Take by mouth.    . Glucosamine HCl 1500 MG TABS Take by mouth.    Marland Kitchen glucosamine-chondroitin 500-400 MG tablet Take 1 tablet by mouth 2 (two) times daily before a meal.    . levothyroxine (SYNTHROID, LEVOTHROID) 75 MCG tablet Take 75 mcg by mouth daily.    . Omega-3 Fatty Acids (FISH OIL) 1200 MG CPDR Take 1 capsule by mouth daily. 60 capsule 1  .  Polyethyl Glycol-Propyl Glycol 0.4-0.3 % SOLN Place 1 drop into both eyes as needed.    . valACYclovir (VALTREX) 500 MG tablet Take 500 mg by mouth daily.      No current facility-administered medications on file prior to visit.     Allergies:   Allergies  Allergen Reactions  . Other Other (See Comments)    Unknown Anesthesia=vomiting    Physical Exam General: well developed, well nourished elderly Caucasian lady, seated, in no evident distress Head: head normocephalic and atraumatic.  Neck: supple with no carotid or supraclavicular bruits Cardiovascular: regular rate and rhythm, no murmurs Musculoskeletal: no deformity Skin:  no rash/petichiae Vascular:  Normal pulses all extremities  Neurologic Exam Mental Status: Awake and fully alert. Oriented to place and time. Recent and remote memory intact. Attention span, concentration and fund of knowledge appropriate. Mood and affect appropriate. Mini-Mental status exam not done as patient has been enrolled in TRAILBLAZER study and was recently evaluated last week. Recall today 2/3. Able to name only 8 animals with 4 legs. Clock drawing 4/4.  Cranial Nerves: Fundoscopic exam reveals sharp disc margins. Pupils equal, briskly reactive to light. Extraocular movements full without nystagmus. Visual fields full to confrontation. Hearing intact. Facial sensation intact. Face, tongue, palate moves normally and symmetrically.  Motor: Normal bulk and tone. Normal strength in all tested extremity muscles. Sensory.: intact to touch , pinprick , position and vibratory sensation.  Coordination: Rapid alternating movements normal in all extremities. Finger-to-nose and heel-to-shin performed accurately bilaterally. Gait and Station: Arises from chair without difficulty. Stance is normal. Gait demonstrates normal stride length and balance . Able to heel, toe and tandem walk without difficulty.  Reflexes: 1+ and symmetric. Toes downgoing.      ASSESSMENT: 50 year Caucasian lady with mild memory and cognitive difficulties secondary to mild cognitive impairment. Recently enrolled in TRAILBLAZER trial.    PLAN: Patient will continue to take fish oil 1200 mg daily and Apoaequorin for memory loss.  Patient considering stopping Apoaequorin as it is expensive and she is only been seeing a decrease in her memory.  According to research team, patient is able to start either Aricept or Namenda after she has been randomized which she has been.  Patient declines starting anything right now and this will be discussed at 81-month follow-up.  Greater than 50% time during this 25 minute consultation visit was spent on counseling and coordination of care about memory loss and answering questions regarding TRAILBLAZER trial an over-the-counter memory loss supplements.    Patient will return in 6 months or call earlier if needed   Venancio Poisson, [facility]  [facility] [address] [place], [place] [address]  Phone [phone] Fax [phone]

## 2017-06-04 NOTE — Patient Instructions (Signed)
Your Plan:  Continue TRAILBLAZER trial  Continue fish oil 1200mg  daily  Consider starting namenda or aricept if needed    Return in 6 months or call earlier if needed     Thank you for coming to see [place] at [facility]. I hope we have been able to provide you high quality care today.  You may receive a patient satisfaction survey over the next few weeks. We would appreciate your feedback and comments so that we may continue to improve ourselves and the health of our patients.

## 2017-06-10 ENCOUNTER — Other Ambulatory Visit: Payer: Self-pay | Admitting: Neurology

## 2017-06-10 DIAGNOSIS — G309 Alzheimer's disease, unspecified: Secondary | ICD-10-CM

## 2017-06-12 DIAGNOSIS — D229 Melanocytic nevi, unspecified: Secondary | ICD-10-CM | POA: Diagnosis not present

## 2017-06-12 DIAGNOSIS — L570 Actinic keratosis: Secondary | ICD-10-CM | POA: Diagnosis not present

## 2017-06-12 DIAGNOSIS — L299 Pruritus, unspecified: Secondary | ICD-10-CM | POA: Diagnosis not present

## 2017-06-19 ENCOUNTER — Ambulatory Visit
Admission: RE | Admit: 2017-06-19 | Discharge: 2017-06-19 | Disposition: A | Discharge status: Home / Self Care | Payer: No Typology Code available for payment source | Source: Ambulatory Visit | Attending: Neurology | Admitting: Neurology

## 2017-06-19 DIAGNOSIS — G309 Alzheimer's disease, unspecified: Secondary | ICD-10-CM

## 2017-07-06 ENCOUNTER — Telehealth: Payer: Self-pay

## 2017-07-06 NOTE — Telephone Encounter (Signed)
Notes recorded by Marval Regal, RN on 07/06/2017 at 12:29 PM EDT Rn call patient but husband answer the phone. He is on the dpr. The dementia research study MRI scan of brain shows not significant changes to previous done in January 2019.No new or worrisome findings. The husband verbalized understanding,and will tell his wife.

## 2017-07-06 NOTE — Telephone Encounter (Signed)
-----   Message from Garvin Fila, MD sent at 07/06/2017 11:45 AM EDT ----- [name] [name] inform the patient that the dementia research study MRI scan of the brain shows no significant changes compared to the previous scan.No new or worrisome finding

## 2017-07-25 ENCOUNTER — Other Ambulatory Visit: Payer: Self-pay | Admitting: Neurology

## 2017-07-25 MED ORDER — DONEPEZIL HCL 5 MG PO TABS: 5.0000 mg | ORAL_TABLET | Freq: Every day | ORAL | 0 refills | Status: DC

## 2017-07-26 ENCOUNTER — Other Ambulatory Visit: Payer: Self-pay | Admitting: Neurology

## 2017-07-26 DIAGNOSIS — G309 Alzheimer's disease, unspecified: Secondary | ICD-10-CM

## 2017-08-06 ENCOUNTER — Other Ambulatory Visit: Payer: Self-pay | Admitting: Neurology

## 2017-08-09 ENCOUNTER — Telehealth: Payer: Self-pay

## 2017-08-09 NOTE — Telephone Encounter (Signed)
RN spoke with [name] [name] in research. The refill is okay for memory for aricpet. Rn stated pt cancel appt with [name] that was schedule in 6 months. [name] stated pt will be seen by research for now. If patients memory gets worse, they will notify pt to call [facility] to schedule with [name] or [name]. [name] was made aware of this.

## 2017-08-10 ENCOUNTER — Other Ambulatory Visit: Payer: Self-pay | Admitting: Neurology

## 2017-08-10 DIAGNOSIS — G309 Alzheimer's disease, unspecified: Secondary | ICD-10-CM

## 2017-08-15 DIAGNOSIS — D229 Melanocytic nevi, unspecified: Secondary | ICD-10-CM | POA: Diagnosis not present

## 2017-08-15 DIAGNOSIS — L309 Dermatitis, unspecified: Secondary | ICD-10-CM | POA: Diagnosis not present

## 2017-08-17 DIAGNOSIS — Z6824 Body mass index (BMI) 24.0-24.9, adult: Secondary | ICD-10-CM | POA: Diagnosis not present

## 2017-08-17 DIAGNOSIS — W57XXXA Bitten or stung by nonvenomous insect and other nonvenomous arthropods, initial encounter: Secondary | ICD-10-CM | POA: Diagnosis not present

## 2017-08-17 DIAGNOSIS — R21 Rash and other nonspecific skin eruption: Secondary | ICD-10-CM | POA: Diagnosis not present

## 2017-08-28 ENCOUNTER — Ambulatory Visit
Admission: RE | Admit: 2017-08-28 | Discharge: 2017-08-28 | Disposition: A | Discharge status: Home / Self Care | Payer: No Typology Code available for payment source | Source: Ambulatory Visit | Attending: Neurology | Admitting: Neurology

## 2017-08-28 DIAGNOSIS — G309 Alzheimer's disease, unspecified: Secondary | ICD-10-CM

## 2017-09-06 DIAGNOSIS — S83242A Other tear of medial meniscus, current injury, left knee, initial encounter: Secondary | ICD-10-CM | POA: Diagnosis not present

## 2017-09-07 ENCOUNTER — Other Ambulatory Visit (HOSPITAL_COMMUNITY): Payer: Self-pay | Admitting: Family Medicine

## 2017-09-07 DIAGNOSIS — Z1231 Encounter for screening mammogram for malignant neoplasm of breast: Secondary | ICD-10-CM

## 2017-09-14 ENCOUNTER — Encounter (HOSPITAL_COMMUNITY): Payer: Self-pay

## 2017-09-14 ENCOUNTER — Ambulatory Visit (HOSPITAL_COMMUNITY)
Admission: RE | Admit: 2017-09-14 | Discharge: 2017-09-14 | Disposition: A | Discharge status: Home / Self Care | Payer: Medicare Other | Source: Ambulatory Visit | Attending: Family Medicine | Admitting: Family Medicine

## 2017-09-14 DIAGNOSIS — Z1231 Encounter for screening mammogram for malignant neoplasm of breast: Secondary | ICD-10-CM | POA: Diagnosis not present

## 2017-09-26 ENCOUNTER — Ambulatory Visit
Admission: RE | Admit: 2017-09-26 | Discharge: 2017-09-26 | Disposition: A | Discharge status: Home / Self Care | Payer: Medicare Other | Source: Ambulatory Visit | Attending: Neurology | Admitting: Neurology

## 2017-09-26 DIAGNOSIS — G309 Alzheimer's disease, unspecified: Secondary | ICD-10-CM

## 2017-10-02 DIAGNOSIS — Z961 Presence of intraocular lens: Secondary | ICD-10-CM | POA: Diagnosis not present

## 2017-10-02 DIAGNOSIS — H35361 Drusen (degenerative) of macula, right eye: Secondary | ICD-10-CM | POA: Diagnosis not present

## 2017-10-02 DIAGNOSIS — H43393 Other vitreous opacities, bilateral: Secondary | ICD-10-CM | POA: Diagnosis not present

## 2017-10-02 DIAGNOSIS — H04123 Dry eye syndrome of bilateral lacrimal glands: Secondary | ICD-10-CM | POA: Diagnosis not present

## 2017-10-25 ENCOUNTER — Other Ambulatory Visit: Payer: Self-pay | Admitting: Neurology

## 2017-10-25 DIAGNOSIS — G309 Alzheimer's disease, unspecified: Secondary | ICD-10-CM

## 2017-11-06 DIAGNOSIS — M958 Other specified acquired deformities of musculoskeletal system: Secondary | ICD-10-CM | POA: Diagnosis not present

## 2017-11-06 DIAGNOSIS — M948X6 Other specified disorders of cartilage, lower leg: Secondary | ICD-10-CM | POA: Diagnosis not present

## 2017-11-06 DIAGNOSIS — S83242A Other tear of medial meniscus, current injury, left knee, initial encounter: Secondary | ICD-10-CM | POA: Diagnosis not present

## 2017-11-06 DIAGNOSIS — M23232 Derangement of other medial meniscus due to old tear or injury, left knee: Secondary | ICD-10-CM | POA: Diagnosis not present

## 2017-11-06 DIAGNOSIS — G8918 Other acute postprocedural pain: Secondary | ICD-10-CM | POA: Diagnosis not present

## 2017-11-19 ENCOUNTER — Ambulatory Visit
Admission: RE | Admit: 2017-11-19 | Discharge: 2017-11-19 | Disposition: A | Discharge status: Home / Self Care | Payer: No Typology Code available for payment source | Source: Ambulatory Visit | Attending: Neurology | Admitting: Neurology

## 2017-11-19 DIAGNOSIS — G309 Alzheimer's disease, unspecified: Secondary | ICD-10-CM

## 2017-11-21 ENCOUNTER — Ambulatory Visit: Payer: Medicare Other | Admitting: Adult Health

## 2017-12-07 ENCOUNTER — Other Ambulatory Visit: Payer: Self-pay | Admitting: Neurology

## 2017-12-07 DIAGNOSIS — G309 Alzheimer's disease, unspecified: Secondary | ICD-10-CM

## 2018-01-15 DIAGNOSIS — K219 Gastro-esophageal reflux disease without esophagitis: Secondary | ICD-10-CM | POA: Diagnosis not present

## 2018-01-15 DIAGNOSIS — E039 Hypothyroidism, unspecified: Secondary | ICD-10-CM | POA: Diagnosis not present

## 2018-01-15 DIAGNOSIS — E782 Mixed hyperlipidemia: Secondary | ICD-10-CM | POA: Diagnosis not present

## 2018-01-15 DIAGNOSIS — Z23 Encounter for immunization: Secondary | ICD-10-CM | POA: Diagnosis not present

## 2018-01-21 DIAGNOSIS — Z6824 Body mass index (BMI) 24.0-24.9, adult: Secondary | ICD-10-CM | POA: Diagnosis not present

## 2018-01-21 DIAGNOSIS — Z0001 Encounter for general adult medical examination with abnormal findings: Secondary | ICD-10-CM | POA: Diagnosis not present

## 2018-01-29 ENCOUNTER — Ambulatory Visit
Admission: RE | Admit: 2018-01-29 | Discharge: 2018-01-29 | Disposition: A | Discharge status: Home / Self Care | Payer: No Typology Code available for payment source | Source: Ambulatory Visit | Attending: Neurology | Admitting: Neurology

## 2018-01-29 DIAGNOSIS — G309 Alzheimer's disease, unspecified: Secondary | ICD-10-CM

## 2018-01-30 ENCOUNTER — Telehealth: Payer: Self-pay | Admitting: Adult Health

## 2018-01-30 NOTE — Telephone Encounter (Signed)
I called and spoke to the patient's husband who informed me that the patient was having transient 30 sec episodes of dizziness without nausea or room spinning or loss of vision.  This was happening even before she started participation in the [place] trial but now seems to be occurring more frequently.  The patient has an upcoming [employer] dementia trial visit on December 5.  I recommend we discuss this further during this visit.  He voiced understanding.

## 2018-01-30 NOTE — Telephone Encounter (Signed)
I would advise patient to follow-up with PCP in this regards and if she is not able to be seen relatively quickly by PCP, to proceed to ED for further evaluation to ensure she is not experiencing acute stroke.

## 2018-01-30 NOTE — Telephone Encounter (Signed)
Pt is experiencing dizziness, walking, standing.  Pt has never experienced this before.  Pt is asking for a call to discuss.  Pt asking RN calls her either before 12 noon or after 4:30 today

## 2018-01-30 NOTE — Telephone Encounter (Signed)
Rn call patients house and she was not at home. Rn was speaking with the husband. Rn call this am about dizziness, walking, standing. Rn ask if pt was driving alone. The husband stated his wife was driving. The husband explain his wife dizziness, spells, swimmy headed and wants an appt with Dr. Fransico Meadow stated it was recommend she seek the PCP office for an appt. The husband stated its not a urgent issue it happens intermittently. The husband stated we have done scans before on her head. The pt is concern about this. Rn stated we are concern too and request she seek PCP to be evaluate. Rn stated message will be sent to [name]. The husband verbalized understanding.

## 2018-02-26 DIAGNOSIS — M85832 Other specified disorders of bone density and structure, left forearm: Secondary | ICD-10-CM | POA: Diagnosis not present

## 2018-02-26 DIAGNOSIS — M810 Age-related osteoporosis without current pathological fracture: Secondary | ICD-10-CM | POA: Diagnosis not present

## 2018-02-26 DIAGNOSIS — Z780 Asymptomatic menopausal state: Secondary | ICD-10-CM | POA: Diagnosis not present

## 2018-04-05 DIAGNOSIS — M1712 Unilateral primary osteoarthritis, left knee: Secondary | ICD-10-CM | POA: Diagnosis not present

## 2018-04-05 DIAGNOSIS — M8580 Other specified disorders of bone density and structure, unspecified site: Secondary | ICD-10-CM | POA: Diagnosis not present

## 2018-04-05 DIAGNOSIS — M810 Age-related osteoporosis without current pathological fracture: Secondary | ICD-10-CM | POA: Diagnosis not present

## 2018-04-05 DIAGNOSIS — M25562 Pain in left knee: Secondary | ICD-10-CM | POA: Diagnosis not present

## 2018-05-08 DIAGNOSIS — M47816 Spondylosis without myelopathy or radiculopathy, lumbar region: Secondary | ICD-10-CM | POA: Diagnosis not present

## 2018-05-08 DIAGNOSIS — M9903 Segmental and somatic dysfunction of lumbar region: Secondary | ICD-10-CM | POA: Diagnosis not present

## 2018-05-08 DIAGNOSIS — S336XXA Sprain of sacroiliac joint, initial encounter: Secondary | ICD-10-CM | POA: Diagnosis not present

## 2018-05-10 DIAGNOSIS — M9903 Segmental and somatic dysfunction of lumbar region: Secondary | ICD-10-CM | POA: Diagnosis not present

## 2018-05-10 DIAGNOSIS — M47816 Spondylosis without myelopathy or radiculopathy, lumbar region: Secondary | ICD-10-CM | POA: Diagnosis not present

## 2018-05-10 DIAGNOSIS — S336XXA Sprain of sacroiliac joint, initial encounter: Secondary | ICD-10-CM | POA: Diagnosis not present

## 2018-05-17 DIAGNOSIS — M170 Bilateral primary osteoarthritis of knee: Secondary | ICD-10-CM | POA: Diagnosis not present

## 2018-05-17 DIAGNOSIS — M25562 Pain in left knee: Secondary | ICD-10-CM | POA: Diagnosis not present

## 2018-05-21 ENCOUNTER — Other Ambulatory Visit: Payer: Self-pay | Admitting: Neurology

## 2018-05-21 DIAGNOSIS — J322 Chronic ethmoidal sinusitis: Secondary | ICD-10-CM | POA: Diagnosis not present

## 2018-05-21 DIAGNOSIS — G309 Alzheimer's disease, unspecified: Secondary | ICD-10-CM

## 2018-05-21 DIAGNOSIS — J342 Deviated nasal septum: Secondary | ICD-10-CM | POA: Diagnosis not present

## 2018-05-21 DIAGNOSIS — J320 Chronic maxillary sinusitis: Secondary | ICD-10-CM | POA: Diagnosis not present

## 2018-05-21 DIAGNOSIS — J370 Chronic laryngitis: Secondary | ICD-10-CM | POA: Diagnosis not present

## 2018-05-28 ENCOUNTER — Other Ambulatory Visit: Payer: Self-pay

## 2018-05-28 ENCOUNTER — Ambulatory Visit
Admission: RE | Admit: 2018-05-28 | Discharge: 2018-05-28 | Disposition: A | Discharge status: Home / Self Care | Payer: No Typology Code available for payment source | Source: Ambulatory Visit | Attending: Neurology | Admitting: Neurology

## 2018-05-28 DIAGNOSIS — G309 Alzheimer's disease, unspecified: Secondary | ICD-10-CM

## 2018-05-31 DIAGNOSIS — J320 Chronic maxillary sinusitis: Secondary | ICD-10-CM | POA: Diagnosis not present

## 2018-05-31 DIAGNOSIS — J322 Chronic ethmoidal sinusitis: Secondary | ICD-10-CM | POA: Diagnosis not present

## 2018-05-31 DIAGNOSIS — J370 Chronic laryngitis: Secondary | ICD-10-CM | POA: Diagnosis not present

## 2018-06-03 ENCOUNTER — Other Ambulatory Visit: Payer: Self-pay | Admitting: Neurology

## 2018-06-06 ENCOUNTER — Telehealth: Payer: Self-pay

## 2018-06-06 ENCOUNTER — Other Ambulatory Visit: Payer: Self-pay

## 2018-06-06 MED ORDER — DONEPEZIL HCL 5 MG PO TABS: 5.0000 mg | ORAL_TABLET | Freq: Every day | ORAL | 3 refills | Status: DC

## 2018-06-06 NOTE — Telephone Encounter (Signed)
Patient is aware that her refill was sent to the pharmacy. She was very appreciative for the call. No other questions or concerns at this time.

## 2018-06-12 ENCOUNTER — Other Ambulatory Visit: Payer: Self-pay | Admitting: Physician Assistant

## 2018-06-12 DIAGNOSIS — L570 Actinic keratosis: Secondary | ICD-10-CM | POA: Diagnosis not present

## 2018-06-12 DIAGNOSIS — D485 Neoplasm of uncertain behavior of skin: Secondary | ICD-10-CM | POA: Diagnosis not present

## 2018-08-16 DIAGNOSIS — M1712 Unilateral primary osteoarthritis, left knee: Secondary | ICD-10-CM | POA: Diagnosis not present

## 2018-08-23 DIAGNOSIS — M1712 Unilateral primary osteoarthritis, left knee: Secondary | ICD-10-CM | POA: Diagnosis not present

## 2018-08-30 DIAGNOSIS — M1712 Unilateral primary osteoarthritis, left knee: Secondary | ICD-10-CM | POA: Diagnosis not present

## 2018-09-01 DIAGNOSIS — J029 Acute pharyngitis, unspecified: Secondary | ICD-10-CM | POA: Diagnosis not present

## 2018-09-01 DIAGNOSIS — Z79899 Other long term (current) drug therapy: Secondary | ICD-10-CM | POA: Diagnosis not present

## 2018-09-01 DIAGNOSIS — E039 Hypothyroidism, unspecified: Secondary | ICD-10-CM | POA: Diagnosis not present

## 2018-09-01 DIAGNOSIS — K122 Cellulitis and abscess of mouth: Secondary | ICD-10-CM | POA: Diagnosis not present

## 2018-09-23 ENCOUNTER — Other Ambulatory Visit (HOSPITAL_COMMUNITY): Payer: Self-pay | Admitting: Neurology

## 2018-09-23 DIAGNOSIS — G309 Alzheimer's disease, unspecified: Secondary | ICD-10-CM

## 2018-10-11 DIAGNOSIS — M1712 Unilateral primary osteoarthritis, left knee: Secondary | ICD-10-CM | POA: Diagnosis not present

## 2018-10-18 ENCOUNTER — Other Ambulatory Visit: Payer: Self-pay

## 2018-10-18 ENCOUNTER — Ambulatory Visit
Admission: RE | Admit: 2018-10-18 | Discharge: 2018-10-18 | Disposition: A | Discharge status: Home / Self Care | Payer: No Typology Code available for payment source | Source: Ambulatory Visit | Attending: Neurology | Admitting: Neurology

## 2018-10-18 DIAGNOSIS — G309 Alzheimer's disease, unspecified: Secondary | ICD-10-CM

## 2018-10-25 ENCOUNTER — Other Ambulatory Visit (HOSPITAL_COMMUNITY): Payer: Self-pay | Admitting: Family Medicine

## 2018-10-25 DIAGNOSIS — Z1231 Encounter for screening mammogram for malignant neoplasm of breast: Secondary | ICD-10-CM

## 2018-11-04 DIAGNOSIS — D519 Vitamin B12 deficiency anemia, unspecified: Secondary | ICD-10-CM | POA: Diagnosis not present

## 2018-11-04 DIAGNOSIS — R252 Cramp and spasm: Secondary | ICD-10-CM | POA: Diagnosis not present

## 2018-11-04 DIAGNOSIS — M79604 Pain in right leg: Secondary | ICD-10-CM | POA: Diagnosis not present

## 2018-11-04 DIAGNOSIS — M79605 Pain in left leg: Secondary | ICD-10-CM | POA: Diagnosis not present

## 2018-11-04 DIAGNOSIS — Z6824 Body mass index (BMI) 24.0-24.9, adult: Secondary | ICD-10-CM | POA: Diagnosis not present

## 2018-11-06 DIAGNOSIS — H43393 Other vitreous opacities, bilateral: Secondary | ICD-10-CM | POA: Diagnosis not present

## 2018-11-06 DIAGNOSIS — H353131 Nonexudative age-related macular degeneration, bilateral, early dry stage: Secondary | ICD-10-CM | POA: Diagnosis not present

## 2018-11-06 DIAGNOSIS — H524 Presbyopia: Secondary | ICD-10-CM | POA: Diagnosis not present

## 2018-11-06 DIAGNOSIS — H52203 Unspecified astigmatism, bilateral: Secondary | ICD-10-CM | POA: Diagnosis not present

## 2018-11-06 DIAGNOSIS — Z961 Presence of intraocular lens: Secondary | ICD-10-CM | POA: Diagnosis not present

## 2018-11-06 DIAGNOSIS — H5213 Myopia, bilateral: Secondary | ICD-10-CM | POA: Diagnosis not present

## 2018-11-07 ENCOUNTER — Other Ambulatory Visit (HOSPITAL_COMMUNITY): Payer: Self-pay | Admitting: Neurology

## 2018-11-07 MED ORDER — DONEPEZIL HCL 5 MG PO TABS: 10.0000 mg | ORAL_TABLET | Freq: Every day | ORAL | 3 refills | Status: DC

## 2018-11-11 ENCOUNTER — Ambulatory Visit (HOSPITAL_COMMUNITY): Payer: Medicare Other

## 2018-11-13 ENCOUNTER — Other Ambulatory Visit: Payer: Self-pay

## 2018-11-13 MED ORDER — DONEPEZIL HCL 5 MG PO TABS: 10.0000 mg | ORAL_TABLET | Freq: Every day | ORAL | 3 refills | Status: DC

## 2018-11-15 ENCOUNTER — Telehealth: Payer: Self-pay | Admitting: Neurology

## 2018-11-15 NOTE — Telephone Encounter (Signed)
Dawn from [facility] called saying they have faxed over 2 requests regarding the pt's donepezil (ARICEPT) 5 MG tablet  They are wanting to know if the medication can be switched to 10mg  instead of 5mg  due to insurance not wanting to cover the 5mg  Please advise.

## 2018-11-18 DIAGNOSIS — U071 COVID-19: Secondary | ICD-10-CM | POA: Diagnosis not present

## 2018-11-19 ENCOUNTER — Other Ambulatory Visit: Payer: Self-pay

## 2018-11-19 MED ORDER — DONEPEZIL HCL 10 MG PO TABS: 10.0000 mg | ORAL_TABLET | Freq: Every day | ORAL | 3 refills | Status: DC

## 2018-11-19 NOTE — Telephone Encounter (Signed)
Patient rx of aricept sent to [facility] of 10mg  at [facility] for insurance to cover.

## 2018-11-19 NOTE — Progress Notes (Signed)
Medication change to Aricept 10mg  at [facility].

## 2018-11-29 DIAGNOSIS — M79642 Pain in left hand: Secondary | ICD-10-CM | POA: Diagnosis not present

## 2018-12-09 ENCOUNTER — Ambulatory Visit (HOSPITAL_COMMUNITY)
Admission: RE | Admit: 2018-12-09 | Discharge: 2018-12-09 | Disposition: A | Discharge status: Home / Self Care | Payer: Medicare Other | Source: Ambulatory Visit | Attending: Family Medicine | Admitting: Family Medicine

## 2018-12-09 ENCOUNTER — Other Ambulatory Visit: Payer: Self-pay

## 2018-12-09 DIAGNOSIS — Z1231 Encounter for screening mammogram for malignant neoplasm of breast: Secondary | ICD-10-CM

## 2018-12-10 DIAGNOSIS — M79642 Pain in left hand: Secondary | ICD-10-CM | POA: Diagnosis not present

## 2018-12-10 DIAGNOSIS — J029 Acute pharyngitis, unspecified: Secondary | ICD-10-CM | POA: Diagnosis not present

## 2018-12-10 DIAGNOSIS — M19042 Primary osteoarthritis, left hand: Secondary | ICD-10-CM | POA: Diagnosis not present

## 2018-12-10 DIAGNOSIS — M7989 Other specified soft tissue disorders: Secondary | ICD-10-CM | POA: Diagnosis not present

## 2019-02-01 DIAGNOSIS — S83242A Other tear of medial meniscus, current injury, left knee, initial encounter: Secondary | ICD-10-CM | POA: Diagnosis not present

## 2019-02-10 DIAGNOSIS — E039 Hypothyroidism, unspecified: Secondary | ICD-10-CM | POA: Diagnosis not present

## 2019-02-10 DIAGNOSIS — E782 Mixed hyperlipidemia: Secondary | ICD-10-CM | POA: Diagnosis not present

## 2019-02-10 DIAGNOSIS — R42 Dizziness and giddiness: Secondary | ICD-10-CM | POA: Diagnosis not present

## 2019-02-10 DIAGNOSIS — K219 Gastro-esophageal reflux disease without esophagitis: Secondary | ICD-10-CM | POA: Diagnosis not present

## 2019-02-12 ENCOUNTER — Telehealth: Payer: Self-pay | Admitting: Adult Health

## 2019-02-12 NOTE — Telephone Encounter (Signed)
Pt called wanting to speak to RN about some questions she has on her donepezil (ARICEPT) 10 MG tablet Please advise.

## 2019-02-14 DIAGNOSIS — M25562 Pain in left knee: Secondary | ICD-10-CM | POA: Diagnosis not present

## 2019-02-14 DIAGNOSIS — S83242D Other tear of medial meniscus, current injury, left knee, subsequent encounter: Secondary | ICD-10-CM | POA: Diagnosis not present

## 2019-02-17 NOTE — Telephone Encounter (Signed)
I called pt and she is having diarrhea/cramping with 10mg  aricept.  She has cut in half 5mg  po daily.  She is asking if another medication possible.  She was in TRAIL BLAZER trial this has finished.  I made appt for her to See JM/NP 02-19-19 at 1545.

## 2019-02-19 ENCOUNTER — Ambulatory Visit (INDEPENDENT_AMBULATORY_CARE_PROVIDER_SITE_OTHER): Payer: Medicare Other | Admitting: Adult Health

## 2019-02-19 ENCOUNTER — Encounter: Payer: Self-pay | Admitting: Adult Health

## 2019-02-19 ENCOUNTER — Other Ambulatory Visit: Payer: Self-pay

## 2019-02-19 VITALS — BP 116/58 | HR 67 | Temp 97.4°F | Ht 65.0 in | Wt 153.4 lb

## 2019-02-19 DIAGNOSIS — G309 Alzheimer's disease, unspecified: Secondary | ICD-10-CM | POA: Diagnosis not present

## 2019-02-19 DIAGNOSIS — R413 Other amnesia: Secondary | ICD-10-CM

## 2019-02-19 MED ORDER — MEMANTINE HCL 5 MG PO TABS: ORAL_TABLET | ORAL | 0 refills | Status: DC

## 2019-02-19 MED ORDER — DONEPEZIL HCL 5 MG PO TABS: 5.0000 mg | ORAL_TABLET | Freq: Every day | ORAL | 2 refills | Status: DC

## 2019-02-19 NOTE — Patient Instructions (Signed)
Your Plan:  Recommend starting Namenda 5 mg daily for 7 days and then 5 mg twice daily for 21 days.  If you are tolerating dosage well without difficulty, please call office and we will further increase dose.  Please call office with any side effects Continue Aricept 5 mg daily   Follow-up in 6 months or call earlier if needed     Thank you for coming to see [place] at [facility]. I hope we have been able to provide you high quality care today.  You may receive a patient satisfaction survey over the next few weeks. We would appreciate your feedback and comments so that we may continue to improve ourselves and the health of our patients.    Memantine Tablets What is this medicine? MEMANTINE (MEM an teen) is used to treat dementia caused by Alzheimer's disease. This medicine may be used for other purposes; ask your health care provider or pharmacist if you have questions. COMMON BRAND NAME(S): Namenda What should I tell my health care provider before I take this medicine? They need to know if you have any of these conditions:  difficulty passing urine  kidney disease  liver disease  seizures  an unusual or allergic reaction to memantine, other medicines, foods, dyes, or preservatives  pregnant or trying to get pregnant  breast-feeding How should I use this medicine? Take this medicine by mouth with a glass of water. Follow the directions on the prescription label. You may take this medicine with or without food. Take your doses at regular intervals. Do not take your medicine more often than directed. Continue to take your medicine even if you feel better. Do not stop taking except on the advice of your doctor or health care professional. Talk to your pediatrician regarding the use of this medicine in children. Special care may be needed. Overdosage: If you think you have taken too much of this medicine contact a poison control center or emergency room at  once. NOTE: This medicine is only for you. Do not share this medicine with others. What if I miss a dose? If you miss a dose, take it as soon as you can. If it is almost time for your next dose, take only that dose. Do not take double or extra doses. If you do not take your medicine for several days, contact your health care provider. Your dose may need to be changed. What may interact with this medicine?  acetazolamide  amantadine  cimetidine  dextromethorphan  dofetilide  hydrochlorothiazide  ketamine  metformin  methazolamide  quinidine  ranitidine  sodium bicarbonate  triamterene This list may not describe all possible interactions. Give your health care provider a list of all the medicines, herbs, non-prescription drugs, or dietary supplements you use. Also tell them if you smoke, drink alcohol, or use illegal drugs. Some items may interact with your medicine. What should I watch for while using this medicine? Visit your doctor or health care professional for regular checks on your progress. Check with your doctor or health care professional if there is no improvement in your symptoms or if they get worse. You may get drowsy or dizzy. Do not drive, use machinery, or do anything that needs mental alertness until you know how this drug affects you. Do not stand or sit up quickly, especially if you are an older patient. This reduces the risk of dizzy or fainting spells. Alcohol can make you more drowsy and dizzy. Avoid alcoholic drinks. What side effects  may I notice from receiving this medicine? Side effects that you should report to your doctor or health care professional as soon as possible:  allergic reactions like skin rash, itching or hives, swelling of the face, lips, or tongue  agitation or a feeling of restlessness  depressed mood  dizziness  hallucinations  redness, blistering, peeling or loosening of the skin, including inside the  mouth  seizures  vomiting Side effects that usually do not require medical attention (report to your doctor or health care professional if they continue or are bothersome):  constipation  diarrhea  headache  nausea  trouble sleeping This list may not describe all possible side effects. Call your doctor for medical advice about side effects. You may report side effects to FDA at 1-800-FDA-1088. Where should I keep my medicine? Keep out of the reach of children. Store at room temperature between 15 degrees and 30 degrees C (59 degrees and 86 degrees F). Throw away any unused medicine after the expiration date. NOTE: This sheet is a summary. It may not cover all possible information. If you have questions about this medicine, talk to your doctor, pharmacist, or health care provider.  2020 Elsevier/Gold Standard (2012-12-16 14:10:42)

## 2019-02-19 NOTE — Progress Notes (Signed)
[facility] [address] [place]. [place] [address] [phone]       OFFICE FOLLOW UP VISIT NOTE  [name]. [name] [name] Date of Birth:  26-Jun-1940 Medical Record Number:  [id number]   Referring MD: Roena Malady  Reason for Referral:  Memory loss  HPI: Initial Consult 09/25/2016 (PS): [name] [name] is a pleasant 78 yo elderly Caucasian lady seen today for initial consultation visit for memory loss. History is obtained from the patient, we have provided medical records from primary physician and imaging studies in the electronic medical records. She has been having memory difficulties for more than 6 months. She she states that this mainly involves remembering names of people whom she is known for a long time. She may has trouble at that moment may be able to number the names little while later. She also is has noticed difficulty in speaking and at times has to think about the words before she can formulate sentences and cannot speak fluently as she would like. Different things activities that she would have consider automatic are not as easy and it longer. She often gets quite frustrated with this. She has been taking fish for years but she stopped it several months ago. She is currently taking prevention. She denies any accompanying symptoms and, headaches, weakness gait or balance problems. There is no prior history of strokes, TIAs, seizures, significant head injury or loss of consciousness. She did have MRI scan of the brain done at [facility] on 07/20/26 in which I personally reviewed and appears normal for age. She has not had any lab work for reversible causes of memory loss, EEG study done. She has placed some drainage and does crossword but does not keep herself to bed with mentally challenging activities. She has no family history of Alzheimer's at her brother has cognitive issues but she thinks this may be related to exposure to pesticide while playing on a golf course  several years ago.. She is independent in a total of daily living. She denies any delusions, and reformations, agitation with significant changes in her behavior.   Update 12/04/2016 (PS): she returns for follow-up of after her last visit 2 months ago. She states her short-term memory difficulties about unchanged. She does participate in doing sudoku every day as well as bridge is many times as she can. She had dementia panel labs checked at last visit all of which were normal. She has no neurological complaints. She did try out for the [employer] dementia study and passed the initial memory screen but the study is temporarily on hold due to protocol change. She has had no new health problems. She has started taking fish oil every day is tolerating it well.  UPDATE 06/04/17: Patient is being seen today for six-month follow-up.  She has recently enrolled in the [place] she was inquiring about trial in which she recently had her first appointment last week.  She feels as though her memory has worsened since previous visit 6 months ago.  Does continue to take fish oil 1200 mg daily and Apoaequorin daily.  She does feel as though she does have good days and bad days.  Possibly wanting to be on Aricept or Namenda in the future if she is not seeing improvement with this trial.   Update 02/19/2019: [name] [name] is a 78 year old female who is being seen today for memory follow-up.  She has not had follow-up since 05/2017 as she was previously enrolled in [place] trial but this  has since been completed.  She was started on Aricept 5 mg daily by Dr. Leonie Man on 07/15/2017 but she requested increasing dosage to 10 mg daily due to feeling as though she would benefit from increased dosage in 11/2018 by Dr. Leonie Man.  She called office last week due to concern of diarrhea/cramping on 10 mg of Aricept as well as lower extremity cramping/spasms therefore she cut pill in half back down to 5 mg daily and did have improvement of her  symptoms.  She is questioning use of an additional memory medication as well as any potential additional research trials she could participate in for memory as she did gain benefit in trailblazer trial.      ROS:   14 system review of systems is positive for memory loss and all other systems negative  PMH:  Past Medical History:  Diagnosis Date   Arthritis    Fever blister    GERD (gastroesophageal reflux disease)    Headache    Hypothyroidism     Social History:  Social History   Socioeconomic History   Marital status: Married    Spouse name: Not on file   Number of children: Not on file   Years of education: Not on file   Highest education level: Not on file  Occupational History   Not on file  Social Needs   Financial resource strain: Not on file   Food insecurity    Worry: Not on file    Inability: Not on file   Transportation needs    Medical: Not on file    Non-medical: Not on file  Tobacco Use   Smoking status: Never Smoker   Smokeless tobacco: Never Used  Substance and Sexual Activity   Alcohol use: Yes    Alcohol/week: 1.0 standard drinks    Types: 1 Glasses of wine per week    Comment: occ   Drug use: No   Sexual activity: Not on file  Lifestyle   Physical activity    Days per week: Not on file    Minutes per session: Not on file   Stress: Not on file  Relationships   Social connections    Talks on phone: Not on file    Gets together: Not on file    Attends religious service: Not on file    Active member of club or organization: Not on file    Attends meetings of clubs or organizations: Not on file    Relationship status: Not on file   Intimate partner violence    Fear of current or ex partner: Not on file    Emotionally abused: Not on file    Physically abused: Not on file    Forced sexual activity: Not on file  Other Topics Concern   Not on file  Social History Narrative   Not on file    Medications:     Current Outpatient Medications on File Prior to Visit  Medication Sig Dispense Refill   acetaminophen (TYLENOL) 500 MG tablet Take 1,000 mg by mouth.     Apoaequorin (PREVAGEN EXTRA STRENGTH PO) Take by mouth.     Biotin 10 MG TABS Take by mouth.     glucosamine-chondroitin 500-400 MG tablet Take 1 tablet by mouth 2 (two) times daily before a meal.     levothyroxine (SYNTHROID) 88 MCG tablet Take 88 mcg by mouth daily before breakfast.     Omega-3 Fatty Acids (FISH OIL) 1200 MG CPDR Take 1 capsule by mouth  daily. 60 capsule 1   Polyethyl Glycol-Propyl Glycol 0.4-0.3 % SOLN Place 1 drop into both eyes as needed.     valACYclovir (VALTREX) 500 MG tablet Take 500 mg by mouth daily.      No current facility-administered medications on file prior to visit.     Allergies:   Allergies  Allergen Reactions   Other Other (See Comments)    Unknown Anesthesia=vomiting    Physical Exam  Today's Vitals   02/19/19 1529  BP: (!) 116/58  Pulse: 67  Temp: (!) 97.4 F (36.3 C)  TempSrc: Oral  Weight: 153 lb 6.4 oz (69.6 kg)  Height: 5\' 5"  (1.651 m)   Body mass index is 25.53 kg/m.   General: well developed, well nourished elderly Caucasian lady, seated, in no evident distress Head: head normocephalic and atraumatic.   Neck: supple with no carotid or supraclavicular bruits Cardiovascular: regular rate and rhythm, no murmurs Musculoskeletal: no deformity Skin:  no rash/petichiae Vascular:  Normal pulses all extremities  Neurologic Exam Mental Status: Awake and fully alert. Oriented to place and time.  MMSE - Mini Mental State Exam 02/19/2019 09/28/2016  Not completed: (No Data) -  Orientation to time 4 5  Orientation to Place 5 5  Registration 3 3  Attention/ Calculation 1 5  Recall 1 3  Language- name 2 objects 2 2  Language- repeat 1 1  Language- follow 3 step command 3 3  Language- read & follow direction 1 1  Write a sentence 1 1  Copy design 1 1  Total score 23 30     Cranial Nerves: Pupils equal, briskly reactive to light. Extraocular movements full without nystagmus. Visual fields full to confrontation. Hearing intact. Facial sensation intact. Face, tongue, palate moves normally and symmetrically.  Motor: Normal bulk and tone. Normal strength in all tested extremity muscles. Sensory.: intact to touch , pinprick , position and vibratory sensation.  Coordination: Rapid alternating movements normal in all extremities. Finger-to-nose and heel-to-shin performed accurately bilaterally. Gait and Station: Arises from chair without difficulty. Stance is normal. Gait demonstrates normal stride length and balance . Able to heel, toe and tandem walk without difficulty.  Reflexes: 1+ and symmetric. Toes downgoing.     ASSESSMENT: 11 year Caucasian lady with mild memory and cognitive difficulties secondary to mild cognitive impairment.  Previously enrolled in [place] trial but has since been completed.  She reports after completion of trial, she started seeing memory worsening therefore requested increase Aricept dose from 5 mg to 10 mg daily but unfortunately experience GI side effects therefore decreased back to 5 mg dose with improvement.  She is questioning additional medication use to assist with memory as well as questioning any additional research trials currently in this office   PLAN:  Initiate Namenda on a slow titration due to side effects on Aricept.  Namenda 5 mg daily for 7 days and then 5 mg twice a day for 21 days.  Advised her to call office after the first month if tolerating well and dosage will be increased further Continue Aricept 5 mg nightly Notify research team who spoke to patient and will be in contact with her as they are currently working on initiating memory research trial at this time   Advised to follow-up in 6 months or call earlier if needed  Greater than 50% time during this 25 minute visit was spent on counseling and  coordination of care about memory loss and use of memory medications with potential benefit along  with potential side effects and possible participation in future research trials answered all questions to patient satisfaction    Frann Rider, [facility]  [facility] [address] [place], [place] [address]  Phone [phone] Fax [phone]

## 2019-02-20 NOTE — Progress Notes (Signed)
I agree with the above plan 

## 2019-03-17 ENCOUNTER — Other Ambulatory Visit: Payer: Self-pay | Admitting: Adult Health

## 2019-03-20 ENCOUNTER — Other Ambulatory Visit: Payer: Self-pay | Admitting: Adult Health

## 2019-03-24 ENCOUNTER — Telehealth: Payer: Self-pay | Admitting: Adult Health

## 2019-03-24 MED ORDER — MEMANTINE HCL 5 MG PO TABS: ORAL_TABLET | ORAL | 0 refills | Status: DC

## 2019-03-24 NOTE — Telephone Encounter (Signed)
Pt returning call please call back °

## 2019-03-24 NOTE — Telephone Encounter (Signed)
Pt states she is waiting on this medication to be filled, pt states she has been without it for 2 days.  Please call

## 2019-03-24 NOTE — Addendum Note (Signed)
Addended by: Brandon Melnick on: 03/24/2019 09:59 AM   Modules accepted: Orders

## 2019-03-24 NOTE — Addendum Note (Signed)
Addended by: Brandon Melnick on: 03/24/2019 02:39 PM   Modules accepted: Orders

## 2019-03-24 NOTE — Telephone Encounter (Signed)
Spoke to husband of pt.  She has tolerated the 5mg  po bid, (out the last 2 days).  Restart and then increase? Please advise.

## 2019-03-24 NOTE — Telephone Encounter (Signed)
I called and LMVM for pt to return call.  I presume doing ok and wants to increase but will hold off calling in until speaking with her as may also increase dose.

## 2019-03-24 NOTE — Telephone Encounter (Signed)
Order placed for memantine.

## 2019-03-24 NOTE — Telephone Encounter (Signed)
Restart Namenda and recommend increase with 5 mg a.m. and 10 mg p.m. for 1 week and then 10 mg twice daily thereafter which will be the maximum dose

## 2019-04-03 ENCOUNTER — Telehealth: Payer: Self-pay

## 2019-04-03 MED ORDER — MEMANTINE HCL 10 MG PO TABS: 10.0000 mg | ORAL_TABLET | Freq: Two times a day (BID) | ORAL | 11 refills | Status: DC

## 2019-04-03 NOTE — Telephone Encounter (Signed)
1) Medication(s) Requested (by name): memantine (NAMENDA) 5 MG tablet   2) Pharmacy of Choice: [facility] [address] [place]  [place], [place] [address]

## 2019-04-05 ENCOUNTER — Ambulatory Visit: Payer: Medicare Other

## 2019-04-08 DIAGNOSIS — Z8616 Personal history of COVID-19: Secondary | ICD-10-CM | POA: Diagnosis not present

## 2019-04-08 DIAGNOSIS — J029 Acute pharyngitis, unspecified: Secondary | ICD-10-CM | POA: Diagnosis not present

## 2019-04-08 DIAGNOSIS — J028 Acute pharyngitis due to other specified organisms: Secondary | ICD-10-CM | POA: Diagnosis not present

## 2019-04-11 DIAGNOSIS — M810 Age-related osteoporosis without current pathological fracture: Secondary | ICD-10-CM | POA: Diagnosis not present

## 2019-04-11 DIAGNOSIS — K219 Gastro-esophageal reflux disease without esophagitis: Secondary | ICD-10-CM | POA: Diagnosis not present

## 2019-04-11 DIAGNOSIS — E7849 Other hyperlipidemia: Secondary | ICD-10-CM | POA: Diagnosis not present

## 2019-04-11 DIAGNOSIS — E039 Hypothyroidism, unspecified: Secondary | ICD-10-CM | POA: Diagnosis not present

## 2019-04-14 ENCOUNTER — Ambulatory Visit: Payer: Medicare Other

## 2019-04-29 DIAGNOSIS — M23322 Other meniscus derangements, posterior horn of medial meniscus, left knee: Secondary | ICD-10-CM | POA: Diagnosis not present

## 2019-04-29 DIAGNOSIS — M94262 Chondromalacia, left knee: Secondary | ICD-10-CM | POA: Diagnosis not present

## 2019-04-29 DIAGNOSIS — S83282A Other tear of lateral meniscus, current injury, left knee, initial encounter: Secondary | ICD-10-CM | POA: Diagnosis not present

## 2019-04-29 DIAGNOSIS — M958 Other specified acquired deformities of musculoskeletal system: Secondary | ICD-10-CM | POA: Diagnosis not present

## 2019-05-07 DIAGNOSIS — S83242A Other tear of medial meniscus, current injury, left knee, initial encounter: Secondary | ICD-10-CM | POA: Diagnosis not present

## 2019-05-27 DIAGNOSIS — S83242A Other tear of medial meniscus, current injury, left knee, initial encounter: Secondary | ICD-10-CM | POA: Diagnosis not present

## 2019-08-18 DIAGNOSIS — E039 Hypothyroidism, unspecified: Secondary | ICD-10-CM | POA: Diagnosis not present

## 2019-08-18 DIAGNOSIS — K219 Gastro-esophageal reflux disease without esophagitis: Secondary | ICD-10-CM | POA: Diagnosis not present

## 2019-08-18 DIAGNOSIS — E782 Mixed hyperlipidemia: Secondary | ICD-10-CM | POA: Diagnosis not present

## 2019-08-18 DIAGNOSIS — M810 Age-related osteoporosis without current pathological fracture: Secondary | ICD-10-CM | POA: Diagnosis not present

## 2019-08-18 DIAGNOSIS — E559 Vitamin D deficiency, unspecified: Secondary | ICD-10-CM | POA: Diagnosis not present

## 2019-08-20 ENCOUNTER — Encounter: Payer: Self-pay | Admitting: Adult Health

## 2019-08-20 ENCOUNTER — Other Ambulatory Visit: Payer: Self-pay

## 2019-08-20 ENCOUNTER — Ambulatory Visit: Payer: Medicare Other | Admitting: Adult Health

## 2019-08-20 VITALS — BP 136/73 | HR 61 | Ht 66.0 in | Wt 151.0 lb

## 2019-08-20 DIAGNOSIS — G3184 Mild cognitive impairment, so stated: Secondary | ICD-10-CM | POA: Diagnosis not present

## 2019-08-20 MED ORDER — MEMANTINE HCL 10 MG PO TABS: 10.0000 mg | ORAL_TABLET | Freq: Two times a day (BID) | ORAL | 3 refills | Status: DC

## 2019-08-20 MED ORDER — DONEPEZIL HCL 5 MG PO TABS: 5.0000 mg | ORAL_TABLET | Freq: Every day | ORAL | 3 refills | Status: DC

## 2019-08-20 NOTE — Progress Notes (Signed)
[facility] [address] [place]. [place] [address] [phone]       OFFICE FOLLOW UP VISIT NOTE  [name] [name] Date of Birth:  [birth year] Medical Record Number:  [id number]   Referring MD: Roena Malady  Reason for Referral:  Memory loss  HPI: Initial Consult 09/25/2016 (PS): [name] [name] is a pleasant 79 yo elderly Caucasian lady seen today for initial consultation visit for memory loss. History is obtained from the patient, we have provided medical records from primary physician and imaging studies in the electronic medical records. She has been having memory difficulties for more than 6 months. She she states that this mainly involves remembering names of people whom she is known for a long time. She may has trouble at that moment may be able to number the names little while later. She also is has noticed difficulty in speaking and at times has to think about the words before she can formulate sentences and cannot speak fluently as she would like. Different things activities that she would have consider automatic are not as easy and it longer. She often gets quite frustrated with this. She has been taking fish for years but she stopped it several months ago. She is currently taking prevention. She denies any accompanying symptoms and, headaches, weakness gait or balance problems. There is no prior history of strokes, TIAs, seizures, significant head injury or loss of consciousness. She did have MRI scan of the brain done at [facility] on 07/20/26 in which I personally reviewed and appears normal for age. She has not had any lab work for reversible causes of memory loss, EEG study done. She has placed some drainage and does crossword but does not keep herself to bed with mentally challenging activities. She has no family history of Alzheimer's at her brother has cognitive issues but she thinks this may be related to exposure to pesticide while playing on a golf course  several years ago.. She is independent in a total of daily living. She denies any delusions, and reformations, agitation with significant changes in her behavior.   Update 12/04/2016 (PS): she returns for follow-up of after her last visit 2 months ago. She states her short-term memory difficulties about unchanged. She does participate in doing sudoku every day as well as bridge is many times as she can. She had dementia panel labs checked at last visit all of which were normal. She has no neurological complaints. She did try out for the [employer] dementia study and passed the initial memory screen but the study is temporarily on hold due to protocol change. She has had no new health problems. She has started taking fish oil every day is tolerating it well.  UPDATE 06/04/17: Patient is being seen today for six-month follow-up.  She has recently enrolled in the TRAILBLAZER she was inquiring about trial in which she recently had her first appointment last week.  She feels as though her memory has worsened since previous visit 6 months ago.  Does continue to take fish oil 1200 mg daily and Apoaequorin daily.  She does feel as though she does have good days and bad days.  Possibly wanting to be on Aricept or Namenda in the future if she is not seeing improvement with this trial.   Update 02/19/2019: [name] [name] is a 79 year old female who is being seen today for memory follow-up.  She has not had follow-up since 05/2017 as she was previously enrolled in [place] trial but this  has since been completed.  She was started on Aricept 5 mg daily by Dr. Leonie Man on 07/15/2017 but she requested increasing dosage to 10 mg daily due to feeling as though she would benefit from increased dosage in 11/2018 by Dr. Leonie Man.  She called office last week due to concern of diarrhea/cramping on 10 mg of Aricept as well as lower extremity cramping/spasms therefore she cut pill in half back down to 5 mg daily and did have improvement of her  symptoms.  She is questioning use of an additional memory medication as well as any potential additional research trials she could participate in for memory as she did gain benefit in [facility] trial.  Today 08/20/2019: Returns today for memory follow-up.  Reports memory has been slowly declining since prior visit.  MMSE today 24/30 (prior 23/30).  She has continued on Aricept 5 mg nightly (unable to tolerate 10 mg dosing) and Namenda 10 mg twice daily as well as multiple over-the-counter supplements including fish oil, choline, Centrum Silver, D3 and Biotin.  She has been in contact with [facility] research team in regards to participation in additional research trials and is hopeful she will be able to participate in a trial in the near future.  No further concerns at this time.     ROS:   14 system review of systems is positive for memory loss and all other systems negative  PMH:  Past Medical History:  Diagnosis Date  . Arthritis   . Fever blister   . GERD (gastroesophageal reflux disease)   . Headache   . Hypothyroidism     Social History:  Social History   Socioeconomic History  . Marital status: Married    Spouse name: Not on file  . Number of children: Not on file  . Years of education: Not on file  . Highest education level: Not on file  Occupational History  . Not on file  Tobacco Use  . Smoking status: Never Smoker  . Smokeless tobacco: Never Used  Substance and Sexual Activity  . Alcohol use: Yes    Alcohol/week: 1.0 standard drinks    Types: 1 Glasses of wine per week    Comment: occ  . Drug use: No  . Sexual activity: Not on file  Other Topics Concern  . Not on file  Social History Narrative  . Not on file   Social Determinants of Health   Financial Resource Strain:   . Difficulty of Paying Living Expenses:   Food Insecurity:   . Worried About Charity fundraiser in the Last Year:   . Arboriculturist in the Last Year:   Transportation Needs:   . Lexicographer (Medical):   Marland Kitchen Lack of Transportation (Non-Medical):   Physical Activity:   . Days of Exercise per Week:   . Minutes of Exercise per Session:   Stress:   . Feeling of Stress :   Social Connections:   . Frequency of Communication with Friends and Family:   . Frequency of Social Gatherings with Friends and Family:   . Attends Religious Services:   . Active Member of Clubs or Organizations:   . Attends Archivist Meetings:   Marland Kitchen Marital Status:   Intimate Partner Violence:   . Fear of Current or Ex-Partner:   . Emotionally Abused:   Marland Kitchen Physically Abused:   . Sexually Abused:     Medications:   Current Outpatient Medications on File Prior to Visit  Medication Sig Dispense Refill  . Biotin 10 MG TABS Take by mouth.    . cholecalciferol (VITAMIN D3) 25 MCG (1000 UNIT) tablet Take 1,000 Units by mouth daily.    Marland Kitchen donepezil (ARICEPT) 5 MG tablet Take 1 tablet (5 mg total) by mouth at bedtime. 90 tablet 2  . glucosamine-chondroitin 500-400 MG tablet Take 1 tablet by mouth 2 (two) times daily before a meal.    . levothyroxine (SYNTHROID) 88 MCG tablet Take 88 mcg by mouth daily before breakfast.    . memantine (NAMENDA) 10 MG tablet Take 1 tablet (10 mg total) by mouth 2 (two) times daily. 60 tablet 11  . Omega-3 Fatty Acids (FISH OIL) 1200 MG CPDR Take 1 capsule by mouth daily. 60 capsule 1  . Polyethyl Glycol-Propyl Glycol 0.4-0.3 % SOLN Place 1 drop into both eyes as needed.    . valACYclovir (VALTREX) 500 MG tablet Take 500 mg by mouth daily.      No current facility-administered medications on file prior to visit.    Allergies:   Allergies  Allergen Reactions  . Other Other (See Comments)    Unknown Anesthesia=vomiting    Physical Exam  Today's Vitals   08/20/19 1544  BP: 136/73  Pulse: 61  Weight: 151 lb (68.5 kg)  Height: 5\' 6"  (1.676 m)   Body mass index is 24.37 kg/m.   General: well developed, well nourished, pleasant elderly Caucasian  lady, seated, in no evident distress Head: head normocephalic and atraumatic.   Neck: supple with no carotid or supraclavicular bruits Cardiovascular: regular rate and rhythm, no murmurs Musculoskeletal: no deformity Skin:  no rash/petichiae Vascular:  Normal pulses all extremities  Neurologic Exam Mental Status: Awake and fully alert. Oriented to place and time.  MMSE - Mini Mental State Exam 08/20/2019 02/19/2019 09/28/2016  Not completed: - (No Data) -  Orientation to time 3 4 5   Orientation to Place 5 5 5   Registration 3 3 3   Attention/ Calculation 3 1 5   Recall 1 1 3   Language- name 2 objects 2 2 2   Language- repeat 1 1 1   Language- follow 3 step command 3 3 3   Language- read & follow direction 1 1 1   Write a sentence 1 1 1   Copy design 1 1 1   Total score 24 23 30     Cranial Nerves: Pupils equal, briskly reactive to light. Extraocular movements full without nystagmus. Visual fields full to confrontation. Hearing intact. Facial sensation intact. Face, tongue, palate moves normally and symmetrically.  Motor: Normal bulk and tone. Normal strength in all tested extremity muscles. Sensory.: intact to touch , pinprick , position and vibratory sensation.  Coordination: Rapid alternating movements normal in all extremities. Finger-to-nose and heel-to-shin performed accurately bilaterally. Gait and Station: Arises from chair without difficulty. Stance is normal. Gait demonstrates normal stride length and balance . Able to heel, toe and tandem walk without difficulty.  Reflexes: 1+ and symmetric. Toes downgoing.     ASSESSMENT: 75 year Caucasian lady with mild memory and cognitive difficulties secondary to mild cognitive impairment.  Previously enrolled in [place] trial but has since been completed.  She reports after completion of trial, she started seeing memory worsening therefore requested increase Aricept dose from 5 mg to 10 mg daily but unfortunately experience GI side effects  therefore decreased back to 5 mg dose tolerating well.  She has also been initiated on Namenda 10 mg twice daily tolerating well.  Despite consistent MMSE score, subjectively continued decline of  cognitive impairment   PLAN:  -Continue Aricept 5 mg nightly and Namenda 10 mg twice daily for mild cognitive impairment -Advised to limit over-the-counter supplements as these are likely not providing any additional benefit but she can speak further with PCP or pharmacist -She will continue to follow with [facility] research team in regards to possibly participating in additional research trial   Advised to follow-up in 6 months or call earlier if needed   I spent 23 minutes of face-to-face and non-face-to-face time with patient.  This included previsit chart review, lab review, study review, order entry, electronic health record documentation, patient education   Frann Rider, [facility]  [facility] [address] [place], [place] [address]  Phone [phone] Fax [phone]

## 2019-08-20 NOTE — Patient Instructions (Addendum)
Your Plan:  Continue namenda and aricept at current dosages   Follow up in 6 months or call earlier if needed       Thank you for coming to see [place] at [facility]. I hope we have been able to provide you high quality care today.  You may receive a patient satisfaction survey over the next few weeks. We would appreciate your feedback and comments so that we may continue to improve ourselves and the health of our patients.

## 2019-08-21 DIAGNOSIS — Z6824 Body mass index (BMI) 24.0-24.9, adult: Secondary | ICD-10-CM | POA: Diagnosis not present

## 2019-08-21 DIAGNOSIS — M810 Age-related osteoporosis without current pathological fracture: Secondary | ICD-10-CM | POA: Diagnosis not present

## 2019-08-21 DIAGNOSIS — E782 Mixed hyperlipidemia: Secondary | ICD-10-CM | POA: Diagnosis not present

## 2019-08-21 DIAGNOSIS — Z23 Encounter for immunization: Secondary | ICD-10-CM | POA: Diagnosis not present

## 2019-08-21 DIAGNOSIS — G3184 Mild cognitive impairment, so stated: Secondary | ICD-10-CM | POA: Diagnosis not present

## 2019-08-21 DIAGNOSIS — E039 Hypothyroidism, unspecified: Secondary | ICD-10-CM | POA: Diagnosis not present

## 2019-08-25 NOTE — Progress Notes (Signed)
I agree with the above plan 

## 2019-09-18 DIAGNOSIS — H353131 Nonexudative age-related macular degeneration, bilateral, early dry stage: Secondary | ICD-10-CM | POA: Diagnosis not present

## 2019-09-18 DIAGNOSIS — H16223 Keratoconjunctivitis sicca, not specified as Sjogren's, bilateral: Secondary | ICD-10-CM | POA: Diagnosis not present

## 2019-09-18 DIAGNOSIS — H43393 Other vitreous opacities, bilateral: Secondary | ICD-10-CM | POA: Diagnosis not present

## 2019-09-18 DIAGNOSIS — H5213 Myopia, bilateral: Secondary | ICD-10-CM | POA: Diagnosis not present

## 2019-09-18 DIAGNOSIS — Z961 Presence of intraocular lens: Secondary | ICD-10-CM | POA: Diagnosis not present

## 2019-12-19 DIAGNOSIS — G4762 Sleep related leg cramps: Secondary | ICD-10-CM | POA: Diagnosis not present

## 2019-12-19 DIAGNOSIS — E039 Hypothyroidism, unspecified: Secondary | ICD-10-CM | POA: Diagnosis not present

## 2019-12-19 DIAGNOSIS — Z23 Encounter for immunization: Secondary | ICD-10-CM | POA: Diagnosis not present

## 2019-12-19 DIAGNOSIS — D649 Anemia, unspecified: Secondary | ICD-10-CM | POA: Diagnosis not present

## 2019-12-19 DIAGNOSIS — D529 Folate deficiency anemia, unspecified: Secondary | ICD-10-CM | POA: Diagnosis not present

## 2019-12-19 DIAGNOSIS — D519 Vitamin B12 deficiency anemia, unspecified: Secondary | ICD-10-CM | POA: Diagnosis not present

## 2019-12-19 DIAGNOSIS — M810 Age-related osteoporosis without current pathological fracture: Secondary | ICD-10-CM | POA: Diagnosis not present

## 2019-12-19 DIAGNOSIS — G3184 Mild cognitive impairment, so stated: Secondary | ICD-10-CM | POA: Diagnosis not present

## 2019-12-19 DIAGNOSIS — Z6823 Body mass index (BMI) 23.0-23.9, adult: Secondary | ICD-10-CM | POA: Diagnosis not present

## 2020-01-02 ENCOUNTER — Other Ambulatory Visit: Payer: Self-pay | Admitting: Neurology

## 2020-01-09 ENCOUNTER — Other Ambulatory Visit: Payer: Self-pay | Admitting: Neurology

## 2020-01-12 ENCOUNTER — Other Ambulatory Visit: Payer: Self-pay | Admitting: Adult Health

## 2020-01-12 NOTE — Telephone Encounter (Signed)
Pt. Is requesting an increase dosage to 10mg  on her donepezil (ARICEPT) 5 MG tablet. She states pharmacy will not fill it & they have been trying to get in touch with office.  Pharmacy: [facility] [phone]

## 2020-01-12 NOTE — Telephone Encounter (Signed)
Ok to increase to 10mg  donepezil?

## 2020-01-12 NOTE — Telephone Encounter (Signed)
She previously complained of side effects on Aricept 10 mg daily hence the reason why she is only taking 5 mg.  I have not seen her since June.  Unsure who advised her to increase dosage since that time - if by PCP, she will need to obtain additional refills by PCP

## 2020-01-13 MED ORDER — DONEPEZIL HCL 10 MG PO TABS
10.0000 mg | ORAL_TABLET | Freq: Every day | ORAL | 3 refills | Status: DC
Start: 2020-01-13 — End: 2021-01-03

## 2020-01-13 NOTE — Telephone Encounter (Signed)
She was previously on Aricept 10 mg nightly but reported side effects therefore dosage decreased with resolution of side effects.  She apparently self increased dosage back to 10 mg nightly and has been tolerating well.  A new order will be placed for Aricept 10 mg nightly.  She has a follow-up scheduled on 02/23/2020

## 2020-01-13 NOTE — Telephone Encounter (Signed)
Pt returned call and stated she is home now and able to get the call.

## 2020-01-13 NOTE — Telephone Encounter (Signed)
I called wife.  She states that she has been taking the donepezil 10mg  (last prescription that she had from Dr. Leonie Man, but also of the 5 mg tabs that she has had).  She is tolerating ok and wants to continue.  She felt like she had issue at one point but then retried to take and has done ok.

## 2020-01-13 NOTE — Addendum Note (Signed)
Addended by: Brandon Melnick on: 01/13/2020 11:51 AM   Modules accepted: Orders

## 2020-01-13 NOTE — Telephone Encounter (Signed)
LMVM for pt or family member to call concerning refill of donepezil.

## 2020-01-13 NOTE — Addendum Note (Signed)
Addended by: Mal Misty on: 01/13/2020 12:32 PM   Modules accepted: Orders

## 2020-02-20 DIAGNOSIS — E782 Mixed hyperlipidemia: Secondary | ICD-10-CM | POA: Diagnosis not present

## 2020-02-20 DIAGNOSIS — D519 Vitamin B12 deficiency anemia, unspecified: Secondary | ICD-10-CM | POA: Diagnosis not present

## 2020-02-20 DIAGNOSIS — D529 Folate deficiency anemia, unspecified: Secondary | ICD-10-CM | POA: Diagnosis not present

## 2020-02-20 DIAGNOSIS — K219 Gastro-esophageal reflux disease without esophagitis: Secondary | ICD-10-CM | POA: Diagnosis not present

## 2020-02-20 DIAGNOSIS — D649 Anemia, unspecified: Secondary | ICD-10-CM | POA: Diagnosis not present

## 2020-02-23 ENCOUNTER — Ambulatory Visit: Payer: Medicare Other | Admitting: Adult Health

## 2020-02-24 DIAGNOSIS — G3184 Mild cognitive impairment, so stated: Secondary | ICD-10-CM | POA: Diagnosis not present

## 2020-02-24 DIAGNOSIS — E039 Hypothyroidism, unspecified: Secondary | ICD-10-CM | POA: Diagnosis not present

## 2020-02-24 DIAGNOSIS — G4762 Sleep related leg cramps: Secondary | ICD-10-CM | POA: Diagnosis not present

## 2020-02-24 DIAGNOSIS — Z6824 Body mass index (BMI) 24.0-24.9, adult: Secondary | ICD-10-CM | POA: Diagnosis not present

## 2020-02-24 DIAGNOSIS — Z23 Encounter for immunization: Secondary | ICD-10-CM | POA: Diagnosis not present

## 2020-02-24 DIAGNOSIS — M810 Age-related osteoporosis without current pathological fracture: Secondary | ICD-10-CM | POA: Diagnosis not present

## 2020-02-24 DIAGNOSIS — Z0001 Encounter for general adult medical examination with abnormal findings: Secondary | ICD-10-CM | POA: Diagnosis not present

## 2020-03-16 ENCOUNTER — Other Ambulatory Visit: Payer: Self-pay

## 2020-03-16 ENCOUNTER — Encounter: Payer: Self-pay | Admitting: Adult Health

## 2020-03-16 ENCOUNTER — Telehealth: Payer: Self-pay | Admitting: Adult Health

## 2020-03-16 ENCOUNTER — Ambulatory Visit: Payer: Medicare Other | Admitting: Adult Health

## 2020-03-16 ENCOUNTER — Other Ambulatory Visit: Payer: Self-pay | Admitting: Adult Health

## 2020-03-16 VITALS — BP 114/67 | HR 60 | Ht 66.0 in | Wt 149.6 lb

## 2020-03-16 DIAGNOSIS — G3184 Mild cognitive impairment, so stated: Secondary | ICD-10-CM

## 2020-03-16 DIAGNOSIS — Z006 Encounter for examination for normal comparison and control in clinical research program: Secondary | ICD-10-CM

## 2020-03-16 MED ORDER — MEMANTINE HCL 10 MG PO TABS: 10.0000 mg | ORAL_TABLET | Freq: Two times a day (BID) | ORAL | 3 refills | Status: DC

## 2020-03-16 NOTE — Patient Instructions (Addendum)
Your Plan:  Continue Aricept 10mg  nightly and Namenda 10mg  twice daily   Follow up in 1 year or call earlier if needed   Thank you for coming to see at [facility]. I hope we have been able to provide you high quality care today.  You may receive a patient satisfaction survey over the next few weeks. We would appreciate your feedback and comments so that we may continue to improve ourselves and the health of our patients.

## 2020-03-16 NOTE — Progress Notes (Signed)
[facility] [address] [place]. [place] [address] [phone]       OFFICE FOLLOW UP VISIT NOTE  [name]. ANJALEE [name] Date of Birth:  [birth year] Medical Record Number:  [id number]   Referring MD: Roena Malady  Reason for Referral:  Memory loss  HPI: Initial Consult 09/25/2016 (PS): [name] [name] is a pleasant 80 yo elderly Caucasian lady seen today for initial consultation visit for memory loss. History is obtained from the patient, we have provided medical records from primary physician and imaging studies in the electronic medical records. She has been having memory difficulties for more than 6 months. She she states that this mainly involves remembering names of people whom she is known for a long time. She may has trouble at that moment may be able to number the names little while later. She also is has noticed difficulty in speaking and at times has to think about the words before she can formulate sentences and cannot speak fluently as she would like. Different things activities that she would have consider automatic are not as easy and it longer. She often gets quite frustrated with this. She has been taking fish for years but she stopped it several months ago. She is currently taking prevention. She denies any accompanying symptoms and, headaches, weakness gait or balance problems. There is no prior history of strokes, TIAs, seizures, significant head injury or loss of consciousness. She did have MRI scan of the brain done at [facility] on 07/20/26 in which I personally reviewed and appears normal for age. She has not had any lab work for reversible causes of memory loss, EEG study done. She has placed some drainage and does crossword but does not keep herself to bed with mentally challenging activities. She has no family history of Alzheimer's at her brother has cognitive issues but she thinks this may be related to exposure to pesticide while playing on a golf course  several years ago.. She is independent in a total of daily living. She denies any delusions, and reformations, agitation with significant changes in her behavior.   Update 12/04/2016 (PS): she returns for follow-up of after her last visit 2 months ago. She states her short-term memory difficulties about unchanged. She does participate in doing sudoku every day as well as bridge is many times as she can. She had dementia panel labs checked at last visit all of which were normal. She has no neurological complaints. She did try out for the [employer] dementia study and passed the initial memory screen but the study is temporarily on hold due to protocol change. She has had no new health problems. She has started taking fish oil every day is tolerating it well.  UPDATE 06/04/17: Patient is being seen today for six-month follow-up.  She has recently enrolled in the TRAILBLAZER she was inquiring about trial in which she recently had her first appointment last week.  She feels as though her memory has worsened since previous visit 6 months ago.  Does continue to take fish oil 1200 mg daily and Apoaequorin daily.  She does feel as though she does have good days and bad days.  Possibly wanting to be on Aricept or Namenda in the future if she is not seeing improvement with this trial.   Update 02/19/2019: [name]. [name] is a 80 year old female who is being seen today for memory follow-up.  She has not had follow-up since 05/2017 as she was previously enrolled in [place] trial but this  has since been completed.  She was started on Aricept 5 mg daily by Dr. Leonie Man on 07/15/2017 but she requested increasing dosage to 10 mg daily due to feeling as though she would benefit from increased dosage in 11/2018 by Dr. Leonie Man.  She called office last week due to concern of diarrhea/cramping on 10 mg of Aricept as well as lower extremity cramping/spasms therefore she cut pill in half back down to 5 mg daily and did have improvement of her  symptoms.  She is questioning use of an additional memory medication as well as any potential additional research trials she could participate in for memory as she did gain benefit in [facility] trial.  Update 08/20/2019 JM: Returns today for memory follow-up.  Reports memory has been slowly declining since prior visit.  MMSE today 24/30 (prior 23/30).  She has continued on Aricept 5 mg nightly (unable to tolerate 10 mg dosing) and Namenda 10 mg twice daily as well as multiple over-the-counter supplements including fish oil, choline, Centrum Silver, D3 and Biotin.  She has been in contact with [facility] research team in regards to participation in additional research trials and is hopeful she will be able to participate in a trial in the near future.  No further concerns at this time.  Update 03/16/2020 JM: Returns for 49-month cognitive follow-up.  Subjectively, cognition has been stable.  Objectively, MMSE today 27/30 (prior 24/30).  She has remained on Aricept 10 mg nightly and Namenda 10 mg twice daily tolerating well without side effects.  She continues to maintain ADLs and IADLs independently.  She is on the process of restarting the second part of TRAILBLAZER trial through [facility] research.  No concerns at this time.    ROS:   14 system review of systems is positive for memory loss and all other systems negative  PMH:  Past Medical History:  Diagnosis Date  . Arthritis   . Fever blister   . GERD (gastroesophageal reflux disease)   . Headache   . Hypothyroidism     Social History:  Social History   Socioeconomic History  . Marital status: Married    Spouse name: Not on file  . Number of children: Not on file  . Years of education: Not on file  . Highest education level: Not on file  Occupational History  . Not on file  Tobacco Use  . Smoking status: Never Smoker  . Smokeless tobacco: Never Used  Substance and Sexual Activity  . Alcohol use: Yes    Alcohol/week: 1.0 standard drink     Types: 1 Glasses of wine per week    Comment: occ  . Drug use: No  . Sexual activity: Not on file  Other Topics Concern  . Not on file  Social History Narrative  . Not on file   Social Determinants of Health   Financial Resource Strain: Not on file  Food Insecurity: Not on file  Transportation Needs: Not on file  Physical Activity: Not on file  Stress: Not on file  Social Connections: Not on file  Intimate Partner Violence: Not on file    Medications:   Current Outpatient Medications on File Prior to Visit  Medication Sig Dispense Refill  . Biotin 10 MG TABS Take by mouth.    . cholecalciferol (VITAMIN D3) 25 MCG (1000 UNIT) tablet Take 1,000 Units by mouth daily.    Marland Kitchen donepezil (ARICEPT) 10 MG tablet Take 1 tablet (10 mg total) by mouth at bedtime. 90 tablet 3  .  levothyroxine (SYNTHROID) 88 MCG tablet Take 88 mcg by mouth daily before breakfast.    . Omega-3 Fatty Acids (FISH OIL) 1200 MG CPDR Take 1 capsule by mouth daily. 60 capsule 1  . Polyethyl Glycol-Propyl Glycol 0.4-0.3 % SOLN Place 1 drop into both eyes as needed.    . valACYclovir (VALTREX) 500 MG tablet Take 500 mg by mouth daily.     No current facility-administered medications on file prior to visit.    Allergies:   Allergies  Allergen Reactions  . Other Other (See Comments)    Unknown Anesthesia=vomiting    Physical Exam  Today's Vitals   03/16/20 1413  BP: 114/67  Pulse: 60  Weight: 149 lb 9.6 oz (67.9 kg)  Height: 5\' 6"  (1.676 m)   Body mass index is 24.15 kg/m.   General: well developed, well nourished, pleasant elderly Caucasian lady, seated, in no evident distress Head: head normocephalic and atraumatic.   Neck: supple with no carotid or supraclavicular bruits Cardiovascular: regular rate and rhythm, no murmurs Musculoskeletal: no deformity Skin:  no rash/petichiae Vascular:  Normal pulses all extremities  Neurologic Exam Mental Status: Awake and fully alert. Oriented to place and  time.  MMSE - Mini Mental State Exam 03/16/2020 08/20/2019 02/19/2019  Not completed: - - (No Data)  Orientation to time 5 3 4   Orientation to Place 5 5 5   Registration 3 3 3   Attention/ Calculation 5 3 1   Recall 0 1 1  Language- name 2 objects 2 2 2   Language- repeat 1 1 1   Language- follow 3 step command 3 3 3   Language- read & follow direction 1 1 1   Write a sentence 1 1 1   Copy design 1 1 1   Copy design-comments 4 legged animal x 1 minute: 7 - -  Total score 27 24 23    Cranial Nerves: Pupils equal, briskly reactive to light. Extraocular movements full without nystagmus. Visual fields full to confrontation. Hearing intact. Facial sensation intact. Face, tongue, palate moves normally and symmetrically.  Motor: Normal bulk and tone. Normal strength in all tested extremity muscles. Sensory.: intact to touch , pinprick , position and vibratory sensation.  Coordination: Rapid alternating movements normal in all extremities. Finger-to-nose and heel-to-shin performed accurately bilaterally. Gait and Station: Arises from chair without difficulty. Stance is normal. Gait demonstrates normal stride length and balance . Able to heel, toe and tandem walk without difficulty.  Reflexes: 1+ and symmetric. Toes downgoing.      ASSESSMENT/PLAN: 40 year Caucasian lady with mild memory and cognitive difficulties secondary to mild cognitive impairment which has been relatively stable.  Previously enrolled in [place] trial which has been completed and currently in the process of starting second phase of TRAILBLAZER trial through [facility] research.   -Continue Aricept 10 mg nightly and Namenda 10 mg twice daily for mild cognitive impairment -refill provided -Continue to follow-up with [facility] research team for TRAILBLAZER trial    Advised to follow-up in 1 month or call earlier if needed   I spent 30 minutes of face-to-face and non-face-to-face time with patient.  This included previsit chart review, lab  review, study review, order entry, electronic health record documentation, patient education and discussion regarding completion and review of MMSE, ongoing use of medications, participation in research trial and answered all other questions to patient satisfaction   , [name]  [facility] [address] [place],   Phone [phone] Fax [phone]

## 2020-03-16 NOTE — Telephone Encounter (Signed)
[employer] medicare order sent to GI. No auth they will reach out to the patient to schedule.  

## 2020-03-16 NOTE — Progress Notes (Signed)
I agree with the above plan 

## 2020-03-17 ENCOUNTER — Other Ambulatory Visit: Payer: Self-pay | Admitting: Adult Health

## 2020-03-17 DIAGNOSIS — G3184 Mild cognitive impairment, so stated: Secondary | ICD-10-CM

## 2020-03-17 DIAGNOSIS — Z006 Encounter for examination for normal comparison and control in clinical research program: Secondary | ICD-10-CM

## 2020-03-24 ENCOUNTER — Ambulatory Visit
Admission: RE | Admit: 2020-03-24 | Discharge: 2020-03-24 | Disposition: A | Discharge status: Home / Self Care | Payer: No Typology Code available for payment source | Source: Ambulatory Visit | Attending: Adult Health | Admitting: Adult Health

## 2020-03-24 ENCOUNTER — Other Ambulatory Visit: Payer: Self-pay

## 2020-03-24 DIAGNOSIS — Z006 Encounter for examination for normal comparison and control in clinical research program: Secondary | ICD-10-CM

## 2020-03-24 DIAGNOSIS — G3184 Mild cognitive impairment, so stated: Secondary | ICD-10-CM

## 2020-04-05 ENCOUNTER — Other Ambulatory Visit: Payer: Medicare Other

## 2020-04-19 ENCOUNTER — Other Ambulatory Visit: Payer: Self-pay | Admitting: Neurology

## 2020-04-19 DIAGNOSIS — G309 Alzheimer's disease, unspecified: Secondary | ICD-10-CM

## 2020-04-20 ENCOUNTER — Other Ambulatory Visit: Payer: Self-pay | Admitting: Neurology

## 2020-04-20 DIAGNOSIS — G309 Alzheimer's disease, unspecified: Secondary | ICD-10-CM

## 2020-04-26 ENCOUNTER — Ambulatory Visit
Admission: RE | Admit: 2020-04-26 | Discharge: 2020-04-26 | Disposition: A | Discharge status: Home / Self Care | Payer: No Typology Code available for payment source | Source: Ambulatory Visit | Attending: Neurology | Admitting: Neurology

## 2020-04-26 ENCOUNTER — Other Ambulatory Visit: Payer: Self-pay

## 2020-04-26 DIAGNOSIS — G309 Alzheimer's disease, unspecified: Secondary | ICD-10-CM

## 2020-04-28 NOTE — Progress Notes (Signed)
Kindly inform the patient that MRI scan of the brain shows expected changes of mild shrinkage of the brain but no new or worrisome abnormalities.

## 2020-05-28 ENCOUNTER — Other Ambulatory Visit: Payer: Self-pay | Admitting: Neurology

## 2020-05-28 DIAGNOSIS — G309 Alzheimer's disease, unspecified: Secondary | ICD-10-CM

## 2020-06-15 ENCOUNTER — Other Ambulatory Visit: Payer: No Typology Code available for payment source

## 2020-06-15 ENCOUNTER — Other Ambulatory Visit: Payer: Self-pay

## 2020-06-15 ENCOUNTER — Ambulatory Visit
Admission: RE | Admit: 2020-06-15 | Discharge: 2020-06-15 | Disposition: A | Discharge status: Home / Self Care | Payer: No Typology Code available for payment source | Source: Ambulatory Visit | Attending: Neurology | Admitting: Neurology

## 2020-06-15 DIAGNOSIS — G309 Alzheimer's disease, unspecified: Secondary | ICD-10-CM

## 2020-06-18 DIAGNOSIS — G309 Alzheimer's disease, unspecified: Secondary | ICD-10-CM | POA: Diagnosis not present

## 2020-06-18 DIAGNOSIS — G4700 Insomnia, unspecified: Secondary | ICD-10-CM | POA: Diagnosis not present

## 2020-06-18 DIAGNOSIS — Z79899 Other long term (current) drug therapy: Secondary | ICD-10-CM | POA: Diagnosis not present

## 2020-06-21 NOTE — Progress Notes (Signed)
Kindly inform the patient that dementia study specific MRI scan of the brain shows mild shrinkage of the brain.  No new or worrisome finding

## 2020-06-22 ENCOUNTER — Encounter: Payer: Self-pay | Admitting: *Deleted

## 2020-08-20 DIAGNOSIS — E7849 Other hyperlipidemia: Secondary | ICD-10-CM | POA: Diagnosis not present

## 2020-08-20 DIAGNOSIS — E039 Hypothyroidism, unspecified: Secondary | ICD-10-CM | POA: Diagnosis not present

## 2020-08-20 DIAGNOSIS — K219 Gastro-esophageal reflux disease without esophagitis: Secondary | ICD-10-CM | POA: Diagnosis not present

## 2020-08-20 DIAGNOSIS — E782 Mixed hyperlipidemia: Secondary | ICD-10-CM | POA: Diagnosis not present

## 2020-08-23 DIAGNOSIS — G309 Alzheimer's disease, unspecified: Secondary | ICD-10-CM | POA: Diagnosis not present

## 2020-08-23 DIAGNOSIS — E039 Hypothyroidism, unspecified: Secondary | ICD-10-CM | POA: Diagnosis not present

## 2020-08-23 DIAGNOSIS — G3184 Mild cognitive impairment, so stated: Secondary | ICD-10-CM | POA: Diagnosis not present

## 2020-08-23 DIAGNOSIS — M810 Age-related osteoporosis without current pathological fracture: Secondary | ICD-10-CM | POA: Diagnosis not present

## 2020-08-23 DIAGNOSIS — Z6823 Body mass index (BMI) 23.0-23.9, adult: Secondary | ICD-10-CM | POA: Diagnosis not present

## 2020-08-25 ENCOUNTER — Other Ambulatory Visit: Payer: Self-pay | Admitting: Neurology

## 2020-08-25 DIAGNOSIS — G309 Alzheimer's disease, unspecified: Secondary | ICD-10-CM

## 2020-08-26 DIAGNOSIS — M810 Age-related osteoporosis without current pathological fracture: Secondary | ICD-10-CM | POA: Diagnosis not present

## 2020-08-27 ENCOUNTER — Other Ambulatory Visit: Payer: Self-pay | Admitting: Neurology

## 2020-08-27 DIAGNOSIS — G309 Alzheimer's disease, unspecified: Secondary | ICD-10-CM

## 2020-09-07 ENCOUNTER — Ambulatory Visit
Admission: RE | Admit: 2020-09-07 | Discharge: 2020-09-07 | Disposition: A | Discharge status: Home / Self Care | Payer: No Typology Code available for payment source | Source: Ambulatory Visit | Attending: Neurology | Admitting: Neurology

## 2020-09-07 DIAGNOSIS — G309 Alzheimer's disease, unspecified: Secondary | ICD-10-CM

## 2020-09-08 ENCOUNTER — Other Ambulatory Visit: Payer: Self-pay

## 2020-09-08 ENCOUNTER — Ambulatory Visit
Admission: RE | Admit: 2020-09-08 | Discharge: 2020-09-08 | Disposition: A | Discharge status: Home / Self Care | Payer: No Typology Code available for payment source | Source: Ambulatory Visit | Attending: Neurology | Admitting: Neurology

## 2020-09-08 ENCOUNTER — Other Ambulatory Visit: Payer: Self-pay | Admitting: Neurology

## 2020-09-08 DIAGNOSIS — G309 Alzheimer's disease, unspecified: Secondary | ICD-10-CM

## 2020-09-10 NOTE — Progress Notes (Signed)
Kindly inform the patient that dementia study specific MRI scan of the brain was satisfactory and no worrisome findings were noted.

## 2020-09-16 DIAGNOSIS — H04123 Dry eye syndrome of bilateral lacrimal glands: Secondary | ICD-10-CM | POA: Diagnosis not present

## 2020-09-16 DIAGNOSIS — H43811 Vitreous degeneration, right eye: Secondary | ICD-10-CM | POA: Diagnosis not present

## 2020-09-16 DIAGNOSIS — H43393 Other vitreous opacities, bilateral: Secondary | ICD-10-CM | POA: Diagnosis not present

## 2020-09-17 DIAGNOSIS — M25562 Pain in left knee: Secondary | ICD-10-CM | POA: Diagnosis not present

## 2020-12-13 DIAGNOSIS — J019 Acute sinusitis, unspecified: Secondary | ICD-10-CM | POA: Diagnosis not present

## 2020-12-13 DIAGNOSIS — K219 Gastro-esophageal reflux disease without esophagitis: Secondary | ICD-10-CM | POA: Diagnosis not present

## 2020-12-31 ENCOUNTER — Other Ambulatory Visit: Payer: Self-pay | Admitting: Adult Health

## 2021-01-03 DIAGNOSIS — Z23 Encounter for immunization: Secondary | ICD-10-CM | POA: Diagnosis not present

## 2021-02-17 DIAGNOSIS — E039 Hypothyroidism, unspecified: Secondary | ICD-10-CM | POA: Diagnosis not present

## 2021-02-17 DIAGNOSIS — E782 Mixed hyperlipidemia: Secondary | ICD-10-CM | POA: Diagnosis not present

## 2021-02-17 DIAGNOSIS — E559 Vitamin D deficiency, unspecified: Secondary | ICD-10-CM | POA: Diagnosis not present

## 2021-02-17 DIAGNOSIS — E7849 Other hyperlipidemia: Secondary | ICD-10-CM | POA: Diagnosis not present

## 2021-02-22 DIAGNOSIS — G3184 Mild cognitive impairment, so stated: Secondary | ICD-10-CM | POA: Diagnosis not present

## 2021-02-22 DIAGNOSIS — G309 Alzheimer's disease, unspecified: Secondary | ICD-10-CM | POA: Diagnosis not present

## 2021-02-22 DIAGNOSIS — M810 Age-related osteoporosis without current pathological fracture: Secondary | ICD-10-CM | POA: Diagnosis not present

## 2021-02-22 DIAGNOSIS — Z1389 Encounter for screening for other disorder: Secondary | ICD-10-CM | POA: Diagnosis not present

## 2021-02-22 DIAGNOSIS — Z6823 Body mass index (BMI) 23.0-23.9, adult: Secondary | ICD-10-CM | POA: Diagnosis not present

## 2021-02-22 DIAGNOSIS — E039 Hypothyroidism, unspecified: Secondary | ICD-10-CM | POA: Diagnosis not present

## 2021-03-16 ENCOUNTER — Ambulatory Visit: Payer: Medicare Other | Admitting: Adult Health

## 2021-04-02 ENCOUNTER — Other Ambulatory Visit: Payer: Self-pay | Admitting: Adult Health

## 2021-04-04 ENCOUNTER — Other Ambulatory Visit: Payer: Self-pay | Admitting: *Deleted

## 2021-04-04 DIAGNOSIS — H43391 Other vitreous opacities, right eye: Secondary | ICD-10-CM | POA: Diagnosis not present

## 2021-04-04 DIAGNOSIS — H524 Presbyopia: Secondary | ICD-10-CM | POA: Diagnosis not present

## 2021-04-04 MED ORDER — MEMANTINE HCL 10 MG PO TABS: 10.0000 mg | ORAL_TABLET | Freq: Two times a day (BID) | ORAL | 3 refills | Status: AC

## 2021-04-04 MED ORDER — DONEPEZIL HCL 10 MG PO TABS: 10.0000 mg | ORAL_TABLET | Freq: Every day | ORAL | 0 refills | Status: DC

## 2021-05-03 ENCOUNTER — Ambulatory Visit: Payer: Medicare Other | Admitting: Adult Health

## 2021-05-20 DIAGNOSIS — Z1329 Encounter for screening for other suspected endocrine disorder: Secondary | ICD-10-CM | POA: Diagnosis not present

## 2021-05-20 DIAGNOSIS — K219 Gastro-esophageal reflux disease without esophagitis: Secondary | ICD-10-CM | POA: Diagnosis not present

## 2021-05-20 DIAGNOSIS — E039 Hypothyroidism, unspecified: Secondary | ICD-10-CM | POA: Diagnosis not present

## 2021-06-01 DIAGNOSIS — M25562 Pain in left knee: Secondary | ICD-10-CM | POA: Diagnosis not present

## 2021-06-06 DIAGNOSIS — G3184 Mild cognitive impairment, so stated: Secondary | ICD-10-CM | POA: Diagnosis not present

## 2021-06-06 DIAGNOSIS — E039 Hypothyroidism, unspecified: Secondary | ICD-10-CM | POA: Diagnosis not present

## 2021-06-06 DIAGNOSIS — J309 Allergic rhinitis, unspecified: Secondary | ICD-10-CM | POA: Diagnosis not present

## 2021-06-06 DIAGNOSIS — Z0001 Encounter for general adult medical examination with abnormal findings: Secondary | ICD-10-CM | POA: Diagnosis not present

## 2021-06-06 DIAGNOSIS — M810 Age-related osteoporosis without current pathological fracture: Secondary | ICD-10-CM | POA: Diagnosis not present

## 2021-06-06 DIAGNOSIS — G309 Alzheimer's disease, unspecified: Secondary | ICD-10-CM | POA: Diagnosis not present

## 2021-06-08 ENCOUNTER — Encounter: Payer: Self-pay | Admitting: Adult Health

## 2021-06-08 ENCOUNTER — Ambulatory Visit: Payer: Medicare Other | Admitting: Adult Health

## 2021-06-08 VITALS — BP 119/65 | HR 64 | Ht 65.0 in | Wt 145.6 lb

## 2021-06-08 DIAGNOSIS — G3184 Mild cognitive impairment, so stated: Secondary | ICD-10-CM | POA: Diagnosis not present

## 2021-06-08 MED ORDER — DONEPEZIL HCL 10 MG PO TABS: 10.0000 mg | ORAL_TABLET | Freq: Every day | ORAL | 3 refills | Status: AC

## 2021-06-08 NOTE — Patient Instructions (Addendum)
Your Plan: ? ?Continue current plan -refill for medications provided ? ?I will give our research department your contact information so you can be notified with any new trials they are offering  ? ? ? ? ?Follow up in 1 year or call earlier if needed  ? ?  ? ? ? ?Thank you for coming to see [place] at [facility]. I hope we have been able to provide you high quality care today. ? ?You may receive a patient satisfaction survey over the next few weeks. We would appreciate your feedback and comments so that we may continue to improve ourselves and the health of our patients. ? ?

## 2021-06-08 NOTE — Progress Notes (Signed)
?[facility] ?[address] ?[place]. [place] [address] ?[phone] ? ?     OFFICE FOLLOW UP VISIT NOTE ? ?[name]. [name] [name] ?Date of Birth:  Mar 30, 1940 ?Medical Record Number:  [id number]  ? ?Referring MD: Roena Malady ? ?Reason for Referral:  Memory loss ? ?HPI: ?Initial Consult 09/25/2016 (PS): [name] [name] is a pleasant 81 yo elderly Caucasian lady seen today for initial consultation visit for memory loss. History is obtained from the patient, we have provided medical records from primary physician and imaging studies in the electronic medical records. She has been having memory difficulties for more than 6 months. She she states that this mainly involves remembering names of people whom she is known for a long time. She may has trouble at that moment may be able to number the names little while later. She also is has noticed difficulty in speaking and at times has to think about the words before she can formulate sentences and cannot speak fluently as she would like. Different things activities that she would have consider automatic are not as easy and it longer. She often gets quite frustrated with this. She has been taking fish for years but she stopped it several months ago. She is currently taking prevention. She denies any accompanying symptoms and, headaches, weakness gait or balance problems. There is no prior history of strokes, TIAs, seizures, significant head injury or loss of consciousness. She did have MRI scan of the brain done at [facility] on 07/20/26 in which I personally reviewed and appears normal for age. She has not had any lab work for reversible causes of memory loss, EEG study done. She has placed some drainage and does crossword but does not keep herself to bed with mentally challenging activities. She has no family history of Alzheimer's at her brother has cognitive issues but she thinks this may be related to exposure to pesticide while playing on a golf course  several years ago.. She is independent in a total of daily living. She denies any delusions, and reformations, agitation with significant changes in her behavior.  ? ?Update 12/04/2016 (PS): she returns for follow-up of after her last visit 2 months ago. She states her short-term memory difficulties about unchanged. She does participate in doing sudoku every day as well as bridge is many times as she can. She had dementia panel labs checked at last visit all of which were normal. She has no neurological complaints. She did try out for the [employer] dementia study and passed the initial memory screen but the study is temporarily on hold due to protocol change. She has had no new health problems. She has started taking fish oil every day is tolerating it well. ? ?UPDATE 06/04/17: Patient is being seen today for six-month follow-up.  She has recently enrolled in the TRAILBLAZER she was inquiring about trial in which she recently had her first appointment last week.  She feels as though her memory has worsened since previous visit 6 months ago.  Does continue to take fish oil 1200 mg daily and Apoaequorin daily.  She does feel as though she does have good days and bad days.  Possibly wanting to be on Aricept or Namenda in the future if she is not seeing improvement with this trial.  ? ?Update 02/19/2019: [name] [name] is a 81 year old female who is being seen today for memory follow-up.  She has not had follow-up since 05/2017 as she was previously enrolled in [employer] trial but this  has since been completed.  She was started on Aricept 5 mg daily by Dr. Leonie Man on 07/15/2017 but she requested increasing dosage to 10 mg daily due to feeling as though she would benefit from increased dosage in 11/2018 by Dr. Leonie Man.  She called office last week due to concern of diarrhea/cramping on 10 mg of Aricept as well as lower extremity cramping/spasms therefore she cut pill in half back down to 5 mg daily and did have improvement of her  symptoms.  She is questioning use of an additional memory medication as well as any potential additional research trials she could participate in for memory as she did gain benefit in trailblazer trial. ? ?Update 08/20/2019 JM: Returns today for memory follow-up.  Reports memory has been slowly declining since prior visit.  MMSE today 24/30 (prior 23/30).  She has continued on Aricept 5 mg nightly (unable to tolerate 10 mg dosing) and Namenda 10 mg twice daily as well as multiple over-the-counter supplements including fish oil, choline, Centrum Silver, D3 and Biotin.  She has been in contact with [facility] research team in regards to participation in additional research trials and is hopeful she will be able to participate in a trial in the near future.  No further concerns at this time. ? ?Update 03/16/2020 JM: Returns for 8-monthcognitive follow-up.  Subjectively, cognition has been stable.  Objectively, MMSE today 27/30 (prior 24/30).  She has remained on Aricept 10 mg nightly and Namenda 10 mg twice daily tolerating well without side effects.  She continues to maintain ADLs and IADLs independently.  She is on the process of restarting the second part of [facility]LA[facility]  No concerns at this time. ? ? ?Update 06/08/2021 JM: returns for memory follow up after prior visit 1 year ago. Does report gradual decline since prior visit. MMSE today 25/30 (prior 27/30). Remains on aricept and namenda, denies side effects. Continues to maintain ADLs and IADLs independently. Routinely plays card games with friends. Stays active and maintains a healthy diet. Sleep well. Denies depression/anxiety. She is no longer participating in research trial due to infusion reaction. No new concerns at this time.  ? ? ? ? ? ?ROS:   ?14 system review of systems is positive for memory loss and all other systems negative ? ?PMH:  ?Past Medical History:  ?Diagnosis Date  ? Arthritis   ? Fever blister   ? GERD (gastroesophageal  reflux disease)   ? Headache   ? Hypothyroidism   ? ? ?Social History:  ?Social History  ? ?Socioeconomic History  ? Marital status: Married  ?  Spouse name: Not on file  ? Number of children: 1  ? Years of education: Not on file  ? Highest education level: Some college, no degree  ?Occupational History  ? Not on file  ?Tobacco Use  ? Smoking status: Never  ? Smokeless tobacco: Never  ?Substance and Sexual Activity  ? Alcohol use: Yes  ?  Alcohol/week: 1.0 standard drink  ?  Types: 1 Glasses of wine per week  ?  Comment: occ  ? Drug use: No  ? Sexual activity: Not on file  ?Other Topics Concern  ? Not on file  ?Social History Narrative  ? 06/08/21 lives with husband  ? ?Social Determinants of Health  ? ?Financial Resource Strain: Not on file  ?Food Insecurity: Not on file  ?Transportation Needs: Not on file  ?Physical Activity: Not on file  ?Stress: Not on file  ?Social  Connections: Not on file  ?Intimate Partner Violence: Not on file  ? ? ?Medications:   ?Current Outpatient Medications on File Prior to Visit  ?Medication Sig Dispense Refill  ? Biotin 10 MG TABS Take by mouth.    ? cholecalciferol (VITAMIN D3) 25 MCG (1000 UNIT) tablet Take 1,000 Units by mouth daily.    ? donepezil (ARICEPT) 10 MG tablet Take 1 tablet (10 mg total) by mouth at bedtime. 90 tablet 0  ? levothyroxine (SYNTHROID) 88 MCG tablet Take 88 mcg by mouth daily before breakfast.    ? memantine (NAMENDA) 10 MG tablet Take 1 tablet (10 mg total) by mouth 2 (two) times daily. 180 tablet 3  ? Omega-3 Fatty Acids (FISH OIL) 1200 MG CPDR Take 1 capsule by mouth daily. 60 capsule 1  ? Polyethyl Glycol-Propyl Glycol 0.4-0.3 % SOLN Place 1 drop into both eyes as needed.    ? valACYclovir (VALTREX) 500 MG tablet Take 500 mg by mouth daily.    ? ?No current facility-administered medications on file prior to visit.  ? ? ?Allergies:   ?Allergies  ?Allergen Reactions  ? Other Other (See Comments)  ?  Unknown Anesthesia=vomiting  ? ? ?Physical Exam ? ?Today's  Vitals  ? 06/08/21 1427  ?BP: 119/65  ?Pulse: 64  ?Weight: 145 lb 9.6 oz (66 kg)  ?Height: '5\' 5"'$  (1.651 m)  ? ? ?Body mass index is 24.23 kg/m?.  ? ?General: well developed, well nourished, very pleasant elde

## 2021-09-20 DIAGNOSIS — H35372 Puckering of macula, left eye: Secondary | ICD-10-CM | POA: Diagnosis not present

## 2021-09-22 DIAGNOSIS — G8929 Other chronic pain: Secondary | ICD-10-CM | POA: Diagnosis not present

## 2021-09-22 DIAGNOSIS — M25562 Pain in left knee: Secondary | ICD-10-CM | POA: Diagnosis not present

## 2021-09-22 DIAGNOSIS — M1712 Unilateral primary osteoarthritis, left knee: Secondary | ICD-10-CM | POA: Diagnosis not present

## 2021-09-26 DIAGNOSIS — R590 Localized enlarged lymph nodes: Secondary | ICD-10-CM | POA: Diagnosis not present

## 2021-09-26 DIAGNOSIS — Z6822 Body mass index (BMI) 22.0-22.9, adult: Secondary | ICD-10-CM | POA: Diagnosis not present

## 2021-12-27 DIAGNOSIS — Z23 Encounter for immunization: Secondary | ICD-10-CM | POA: Diagnosis not present

## 2022-01-23 DIAGNOSIS — H04123 Dry eye syndrome of bilateral lacrimal glands: Secondary | ICD-10-CM | POA: Diagnosis not present

## 2022-02-07 DIAGNOSIS — G4762 Sleep related leg cramps: Secondary | ICD-10-CM | POA: Diagnosis not present

## 2022-02-07 DIAGNOSIS — Z6822 Body mass index (BMI) 22.0-22.9, adult: Secondary | ICD-10-CM | POA: Diagnosis not present

## 2022-02-07 DIAGNOSIS — E039 Hypothyroidism, unspecified: Secondary | ICD-10-CM | POA: Diagnosis not present

## 2022-02-07 DIAGNOSIS — D649 Anemia, unspecified: Secondary | ICD-10-CM | POA: Diagnosis not present

## 2022-02-07 DIAGNOSIS — K219 Gastro-esophageal reflux disease without esophagitis: Secondary | ICD-10-CM | POA: Diagnosis not present

## 2022-02-07 DIAGNOSIS — R030 Elevated blood-pressure reading, without diagnosis of hypertension: Secondary | ICD-10-CM | POA: Diagnosis not present

## 2022-02-21 DIAGNOSIS — Z23 Encounter for immunization: Secondary | ICD-10-CM | POA: Diagnosis not present

## 2022-02-27 DIAGNOSIS — H04123 Dry eye syndrome of bilateral lacrimal glands: Secondary | ICD-10-CM | POA: Diagnosis not present

## 2022-04-18 DIAGNOSIS — H524 Presbyopia: Secondary | ICD-10-CM | POA: Diagnosis not present

## 2022-04-18 DIAGNOSIS — H3540 Unspecified peripheral retinal degeneration: Secondary | ICD-10-CM | POA: Diagnosis not present

## 2022-06-06 DIAGNOSIS — H16223 Keratoconjunctivitis sicca, not specified as Sjogren's, bilateral: Secondary | ICD-10-CM | POA: Diagnosis not present

## 2022-06-12 DIAGNOSIS — E7849 Other hyperlipidemia: Secondary | ICD-10-CM | POA: Diagnosis not present

## 2022-06-12 DIAGNOSIS — Z6822 Body mass index (BMI) 22.0-22.9, adult: Secondary | ICD-10-CM | POA: Diagnosis not present

## 2022-06-12 DIAGNOSIS — R030 Elevated blood-pressure reading, without diagnosis of hypertension: Secondary | ICD-10-CM | POA: Diagnosis not present

## 2022-06-12 DIAGNOSIS — M810 Age-related osteoporosis without current pathological fracture: Secondary | ICD-10-CM | POA: Diagnosis not present

## 2022-06-12 DIAGNOSIS — F17210 Nicotine dependence, cigarettes, uncomplicated: Secondary | ICD-10-CM | POA: Diagnosis not present

## 2022-06-12 DIAGNOSIS — D649 Anemia, unspecified: Secondary | ICD-10-CM | POA: Diagnosis not present

## 2022-06-12 DIAGNOSIS — Z0001 Encounter for general adult medical examination with abnormal findings: Secondary | ICD-10-CM | POA: Diagnosis not present

## 2022-06-12 DIAGNOSIS — G309 Alzheimer's disease, unspecified: Secondary | ICD-10-CM | POA: Diagnosis not present

## 2022-06-12 DIAGNOSIS — E039 Hypothyroidism, unspecified: Secondary | ICD-10-CM | POA: Diagnosis not present

## 2022-08-22 DIAGNOSIS — H35372 Puckering of macula, left eye: Secondary | ICD-10-CM | POA: Diagnosis not present

## 2022-10-24 DIAGNOSIS — H35372 Puckering of macula, left eye: Secondary | ICD-10-CM | POA: Diagnosis not present

## 2022-12-20 DIAGNOSIS — M12811 Other specific arthropathies, not elsewhere classified, right shoulder: Secondary | ICD-10-CM | POA: Diagnosis not present

## 2022-12-20 DIAGNOSIS — M19011 Primary osteoarthritis, right shoulder: Secondary | ICD-10-CM | POA: Diagnosis not present

## 2022-12-20 DIAGNOSIS — M75101 Unspecified rotator cuff tear or rupture of right shoulder, not specified as traumatic: Secondary | ICD-10-CM | POA: Diagnosis not present

## 2022-12-21 DIAGNOSIS — Z6821 Body mass index (BMI) 21.0-21.9, adult: Secondary | ICD-10-CM | POA: Diagnosis not present

## 2022-12-21 DIAGNOSIS — Z23 Encounter for immunization: Secondary | ICD-10-CM | POA: Diagnosis not present

## 2022-12-21 DIAGNOSIS — R590 Localized enlarged lymph nodes: Secondary | ICD-10-CM | POA: Diagnosis not present

## 2022-12-21 DIAGNOSIS — M542 Cervicalgia: Secondary | ICD-10-CM | POA: Diagnosis not present

## 2022-12-21 DIAGNOSIS — R030 Elevated blood-pressure reading, without diagnosis of hypertension: Secondary | ICD-10-CM | POA: Diagnosis not present

## 2023-05-21 DIAGNOSIS — H35342 Macular cyst, hole, or pseudohole, left eye: Secondary | ICD-10-CM | POA: Diagnosis not present

## 2023-05-23 NOTE — Progress Notes (Signed)
 [facility] - Clinic Note  05/29/2023   CHIEF COMPLAINT Patient presents for Retina Evaluation  HISTORY OF PRESENT ILLNESS: [name] [name] is a 83 y.o. female who presents to the clinic today for:  HPI     Retina Evaluation   In left eye.  I, the attending physician,  performed the HPI with the patient and updated documentation appropriately.        Comments   Patient here for Retina Evaluation. Referred by Dr Earlene Plater. Patient states vision comes and goes. Sometimes pretty good. Other times not. No eye pain. Only some nasally. Doctor says appears to be a hole developing in retina.       Last edited by Rennis Chris, MD on 05/29/2023  6:29 PM.    Pt is here on the referral of Dr. Earlene Plater for concern of macular hole OS, pts husband states the pt has been complaining about her vision recently, pt endorses having distortion in her vision, pt denies being diabetic or hypertensive  Referring physician: Vernia Buff, DO   HISTORICAL INFORMATION:  Selected notes from the MEDICAL RECORD NUMBER Referred by Dr. Earlene Plater for possible macular hole LEE:  Ocular Hx- PMH-   CURRENT MEDICATIONS: Current Outpatient Medications (Ophthalmic Drugs)  Medication Sig   Polyethyl Glycol-Propyl Glycol 0.4-0.3 % SOLN Place 1 drop into both eyes as needed.   No current facility-administered medications for this visit. (Ophthalmic Drugs)   Current Outpatient Medications (Other)  Medication Sig   Biotin 10 MG TABS Take by mouth.   cholecalciferol (VITAMIN D3) 25 MCG (1000 UNIT) tablet Take 1,000 Units by mouth daily.   donepezil (ARICEPT) 10 MG tablet Take 1 tablet (10 mg total) by mouth at bedtime.   levothyroxine (SYNTHROID) 88 MCG tablet Take 88 mcg by mouth daily before breakfast.   memantine (NAMENDA) 10 MG tablet Take 1 tablet (10 mg total) by mouth 2 (two) times daily.   Omega-3 Fatty Acids (FISH OIL) 1200 MG CPDR Take 1 capsule by mouth daily.   valACYclovir (VALTREX)  500 MG tablet Take 500 mg by mouth daily.   No current facility-administered medications for this visit. (Other)   REVIEW OF SYSTEMS: ROS   Positive for: Eyes Last edited by Laddie Aquas, COA on 05/29/2023  1:36 PM.     ALLERGIES Allergies  Allergen Reactions   Other Other (See Comments)    Unknown Anesthesia=vomiting   PAST MEDICAL HISTORY Past Medical History:  Diagnosis Date   Arthritis    Fever blister    GERD (gastroesophageal reflux disease)    Headache    Hypothyroidism    Past Surgical History:  Procedure Laterality Date   BREAST CYST EXCISION Right    BUNIONECTOMY  1993   both feet   COLONOSCOPY     DILATION AND CURETTAGE OF UTERUS     ERCP  2007   FINGER SURGERY  2009   mucoid cyst lt long finger   JOINT REPLACEMENT  8/13   total rt hip-[facility]   TOTAL HIP ARTHROPLASTY     RT 2013   TUBAL LIGATION     FAMILY HISTORY Family History  Problem Relation Age of Onset   Dementia Brother    SOCIAL HISTORY Social History   Tobacco Use   Smoking status: Never   Smokeless tobacco: Never  Vaping Use   Vaping status: Never Used  Substance Use Topics   Alcohol use: Yes    Alcohol/week: 1.0 standard drink of alcohol  Types: 1 Glasses of wine per week    Comment: occ   Drug use: No       OPHTHALMIC EXAM:  Base Eye Exam     Visual Acuity (Snellen - Linear)       Right Left   Dist cc 20/30 -2 20/50 -1   Dist ph cc NI 20/30 -2    Correction: Glasses         Tonometry (Tonopen, 1:32 PM)       Right Left   Pressure 17 12         Pupils       Dark Light Shape React APD   Right 3 2 Round Brisk None   Left 3 2 Round Brisk None         Visual Fields (Counting fingers)       Left Right    Full Full         Extraocular Movement       Right Left    Full, Ortho Full, Ortho         Neuro/Psych     Oriented x3: Yes   Mood/Affect: Normal         Dilation     Both eyes: 1.0% Mydriacyl, 2.5% Phenylephrine @ 1:32 PM            Slit Lamp and Fundus Exam     Slit Lamp Exam       Right Left   Lids/Lashes Dermatochalasis - upper lid Dermatochalasis - upper lid   Conjunctiva/Sclera White and quiet White and quiet   Cornea Trace tear film debris, well healed cataract wound Trace tear film debris, well healed cataract wound   Anterior Chamber deep and clear deep and clear   Iris Round and dilated Round and dilated   Lens PC IOL in good position PC IOL in good position, 1+ Posterior capsular opacification   Anterior Vitreous mild syneresis, Posterior vitreous detachment mild syneresis         Fundus Exam       Right Left   Disc Pink and Sharp, Compact, temporal PPP Pink and Sharp, Compact, temporal PPA   C/D Ratio 0.4 0.4   Macula Flat, Blunted foveal reflex, RPE mottling, No heme or edema Flat, Good foveal reflex, trace central cyst, no heme, no frank edema at 7 o'clock   Vessels mild attenuation, mild tortuosity mild attenuation, mild tortuosity   Periphery Attached, no edema Attached, no edema           Refraction     Wearing Rx       Sphere Cylinder Axis Add   Right -0.75 +0.75 174 +2.25   Left -0.50 +1.25 169 +2.25         Cycloplegic Refraction       Sphere Cylinder Axis Dist VA   Right -0.50 +1.00 177 30+2   Left -0.75 +1.00 164 60+2           IMAGING AND PROCEDURES  Imaging and Procedures for 05/29/2023  OCT, Retina - OU - Both Eyes       Right Eye Quality was good. Central Foveal Thickness: 273. Progression has no prior data. Findings include normal foveal contour, no IRF, no SRF (+vitreous opacities).   Left Eye Quality was good. Central Foveal Thickness: 271. Progression has no prior data. Findings include normal foveal contour, no SRF, intraretinal fluid (Focal central cyst superior fovea, Partial PVD).   Notes *Images captured and stored on drive  Diagnosis / Impression:  OD: +vitreous opacities OS: Focal central cyst superior fovea, Partial  PVD  Clinical management:  See below  Abbreviations: NFP - Normal foveal profile. CME - cystoid macular edema. PED - pigment epithelial detachment. IRF - intraretinal fluid. SRF - subretinal fluid. EZ - ellipsoid zone. ERM - epiretinal membrane. ORA - outer retinal atrophy. ORT - outer retinal tubulation. SRHM - subretinal hyper-reflective material. IRHM - intraretinal hyper-reflective material           ASSESSMENT/PLAN:   ICD-10-CM   1. Macular retinal cyst of left eye  H35.342 OCT, Retina - OU - Both Eyes    2. Pseudophakia, both eyes  Z96.1      Macular cyst OS - small, focal macular cyst in superior fovea  - BCVA OS 20/25 but pt reports subjective metamorphopsia OS  - unclear etiology, but suspect may be residual cystic changes from prior VMT -- OCT today shows no VMT but partial PVD suggestive of recent release of VMT  - discussed findings, prognosis - no retinal or ophthalmic interventions indicated or recommended at this time  - monitor  - f/u 2-3 months, DFE, OCT  2. Pseudophakia OU  - s/p CE/IOL OU  - IOLs in good position  - monitor  Ophthalmic Meds Ordered this visit:  No orders of the defined types were placed in this encounter.    Return for f/u 2-3 months, macular cyst OS, DFE, OCT.  There are no Patient Instructions on file for this visit.  Explained the diagnoses, plan, and follow up with the patient and they expressed understanding.  Patient expressed understanding of the importance of proper follow up care.   This document serves as a record of services personally performed by Karie Chimera, MD, PhD. It was created on their behalf by Charlette Caffey, COT an ophthalmic technician. The creation of this record is the provider's dictation and/or activities during the visit.    Electronically signed by:  Charlette Caffey, COT  05/29/23 6:34 PM  Karie Chimera, M.D., Ph.D. Diseases & Surgery of the Retina and Vitreous [facility] 05/29/2023  I have reviewed the above documentation for accuracy and completeness, and I agree with the above. Karie Chimera, M.D., Ph.D. 05/29/23 6:34 PM   Abbreviations: M myopia (nearsighted); A astigmatism; H hyperopia (farsighted); P presbyopia; Mrx spectacle prescription;  CTL contact lenses; OD right eye; OS left eye; OU both eyes  XT exotropia; ET esotropia; PEK punctate epithelial keratitis; PEE punctate epithelial erosions; DES dry eye syndrome; MGD meibomian gland dysfunction; ATs artificial tears; PFAT's preservative free artificial tears; NSC nuclear sclerotic cataract; PSC posterior subcapsular cataract; ERM epi-retinal membrane; PVD posterior vitreous detachment; RD retinal detachment; DM diabetes mellitus; DR diabetic retinopathy; NPDR non-proliferative diabetic retinopathy; PDR proliferative diabetic retinopathy; CSME clinically significant macular edema; DME diabetic macular edema; dbh dot blot hemorrhages; CWS cotton wool spot; POAG primary open angle glaucoma; C/D cup-to-disc ratio; HVF humphrey visual field; GVF goldmann visual field; OCT optical coherence tomography; IOP intraocular pressure; BRVO Branch retinal vein occlusion; CRVO central retinal vein occlusion; CRAO central retinal artery occlusion; BRAO branch retinal artery occlusion; RT retinal tear; SB scleral buckle; PPV pars plana vitrectomy; VH Vitreous hemorrhage; PRP panretinal laser photocoagulation; IVK intravitreal kenalog; VMT vitreomacular traction; MH Macular hole;  NVD neovascularization of the disc; NVE neovascularization elsewhere; AREDS age related eye disease study; ARMD age related macular degeneration; POAG primary open angle glaucoma; EBMD epithelial/anterior basement membrane dystrophy;  ACIOL anterior chamber intraocular lens; IOL intraocular lens; PCIOL posterior chamber intraocular lens; Phaco/IOL phacoemulsification with intraocular lens placement; PRK photorefractive keratectomy; LASIK laser assisted  in situ keratomileusis; HTN hypertension; DM diabetes mellitus; COPD chronic obstructive pulmonary disease

## 2023-05-29 ENCOUNTER — Encounter (INDEPENDENT_AMBULATORY_CARE_PROVIDER_SITE_OTHER): Payer: Self-pay | Admitting: Ophthalmology

## 2023-05-29 ENCOUNTER — Ambulatory Visit (INDEPENDENT_AMBULATORY_CARE_PROVIDER_SITE_OTHER): Admitting: Ophthalmology

## 2023-05-29 DIAGNOSIS — Z961 Presence of intraocular lens: Secondary | ICD-10-CM

## 2023-05-29 DIAGNOSIS — H35342 Macular cyst, hole, or pseudohole, left eye: Secondary | ICD-10-CM

## 2023-05-29 DIAGNOSIS — H3581 Retinal edema: Secondary | ICD-10-CM

## 2023-06-19 DIAGNOSIS — E039 Hypothyroidism, unspecified: Secondary | ICD-10-CM | POA: Diagnosis not present

## 2023-06-19 DIAGNOSIS — K219 Gastro-esophageal reflux disease without esophagitis: Secondary | ICD-10-CM | POA: Diagnosis not present

## 2023-06-19 DIAGNOSIS — Z13220 Encounter for screening for lipoid disorders: Secondary | ICD-10-CM | POA: Diagnosis not present

## 2023-06-19 DIAGNOSIS — R590 Localized enlarged lymph nodes: Secondary | ICD-10-CM | POA: Diagnosis not present

## 2023-06-19 DIAGNOSIS — E559 Vitamin D deficiency, unspecified: Secondary | ICD-10-CM | POA: Diagnosis not present

## 2023-06-26 DIAGNOSIS — Z681 Body mass index (BMI) 19 or less, adult: Secondary | ICD-10-CM | POA: Diagnosis not present

## 2023-06-26 DIAGNOSIS — Z0001 Encounter for general adult medical examination with abnormal findings: Secondary | ICD-10-CM | POA: Diagnosis not present

## 2023-06-26 DIAGNOSIS — Z1389 Encounter for screening for other disorder: Secondary | ICD-10-CM | POA: Diagnosis not present

## 2023-06-26 DIAGNOSIS — G309 Alzheimer's disease, unspecified: Secondary | ICD-10-CM | POA: Diagnosis not present

## 2023-06-26 DIAGNOSIS — Z7189 Other specified counseling: Secondary | ICD-10-CM | POA: Diagnosis not present

## 2023-06-26 DIAGNOSIS — E782 Mixed hyperlipidemia: Secondary | ICD-10-CM | POA: Diagnosis not present

## 2023-06-26 DIAGNOSIS — Z0000 Encounter for general adult medical examination without abnormal findings: Secondary | ICD-10-CM | POA: Diagnosis not present

## 2023-06-26 DIAGNOSIS — E039 Hypothyroidism, unspecified: Secondary | ICD-10-CM | POA: Diagnosis not present

## 2023-07-17 ENCOUNTER — Telehealth: Payer: Self-pay | Admitting: Adult Health

## 2023-07-17 NOTE — Telephone Encounter (Signed)
 Patient's husband Porfirio Bristol called in, stated patient has been a long time patient here and she has started having severe headaches when she wakes up in the morning and they are concerned and need to make an appointment to see what is going on. I reviewed chart and we have only previously seen patient for memory here. I let him know we would need a new referral for patient, typically from PCP before we can get her set up for an appointment here for the headaches. Husband states "Well I think that's a bunch of crap, her primary care doctor doesn't have anything to do with brain stuff and she's got a brain problem so you want us  to jump through hoops just to have an appointment with you?" I let him know that it is office policy to have a new referral for all new issues, and we're unfortunately not set up to see patients on an urgent basis, we don't have any soon appointments and don't want patient to have to wait months to see us  for a problem when PCP should go ahead and see patient and start a work up to rule out anything urgent or non neurological causing these headaches. Husband stated well I think this is ridiculous and she has been a long time patient of your office and we should be able to make an appointment for a brain issue. He asked to send a message to Dr. Janett Medin and Camilo Cella and let them know what is going on and advise. I let him know I certainly would.   Please advise-these HA's are a new issue, pt has not seen PCP and apparently they are not amenable to seeing them for this, even though I explained in detail why she should either see them or urgent care/ED. Husband did not accept this answer.

## 2023-07-17 NOTE — Telephone Encounter (Signed)
 Agree with your recommendation.  Patient has not been seen since 05/2021 and has been previously followed for memory loss.  Headaches can be caused from a variety of factors and should initially be evaluated by PCP as well as initiate treatment if warranted. If headaches persist without clear cause and despite first line treatments, that is usually when neurology evaluation is requested.

## 2023-08-02 ENCOUNTER — Telehealth: Payer: Self-pay | Admitting: Neurology

## 2023-08-02 ENCOUNTER — Encounter: Payer: Self-pay | Admitting: Neurology

## 2023-08-02 NOTE — Telephone Encounter (Signed)
 LVM and sent letter in mail informing pt of need to reschedule 10/29/23 appt - MD out

## 2023-08-08 DIAGNOSIS — E039 Hypothyroidism, unspecified: Secondary | ICD-10-CM | POA: Diagnosis not present

## 2023-08-21 NOTE — Progress Notes (Shared)
 [facility] - Clinic Note  08/28/2023   CHIEF COMPLAINT Patient presents for No chief complaint on file.  HISTORY OF PRESENT ILLNESS: [name] [name] is a 83 y.o. female who presents to the clinic today for:    Pt states she feels [facility] isnt as good as last time she was here. Pt repots using systane morning and night but is not consistent.   Referring physician: Gearld Keep, DO   HISTORICAL INFORMATION:  Selected notes from the MEDICAL RECORD NUMBER Referred by Dr. Nolon Baxter for possible macular hole LEE:  Ocular Hx- PMH-   CURRENT MEDICATIONS: Current Outpatient Medications (Ophthalmic Drugs)  Medication Sig   Polyethyl Glycol-Propyl Glycol 0.4-0.3 % SOLN Place 1 drop into both eyes as needed.   No current facility-administered medications for this visit. (Ophthalmic Drugs)   Current Outpatient Medications (Other)  Medication Sig   Biotin 10 MG TABS Take by mouth.   cholecalciferol (VITAMIN D3) 25 MCG (1000 UNIT) tablet Take 1,000 Units by mouth daily.   donepezil  (ARICEPT ) 10 MG tablet Take 1 tablet (10 mg total) by mouth at bedtime.   levothyroxine (SYNTHROID) 88 MCG tablet Take 88 mcg by mouth daily before breakfast.   memantine  (NAMENDA ) 10 MG tablet Take 1 tablet (10 mg total) by mouth 2 (two) times daily.   Omega-3 Fatty Acids (FISH OIL ) 1200 MG CPDR Take 1 capsule by mouth daily.   valACYclovir (VALTREX) 500 MG tablet Take 500 mg by mouth daily.   No current facility-administered medications for this visit. (Other)   REVIEW OF SYSTEMS:   ALLERGIES Allergies  Allergen Reactions   Other Other (See Comments)    Unknown Anesthesia=vomiting   PAST MEDICAL HISTORY Past Medical History:  Diagnosis Date   Arthritis    Fever blister    GERD (gastroesophageal reflux disease)    Headache    Hypothyroidism    Past Surgical History:  Procedure Laterality Date   BREAST CYST EXCISION Right    BUNIONECTOMY  1993   both feet   COLONOSCOPY      DILATION AND CURETTAGE OF UTERUS     ERCP  2007   FINGER SURGERY  2009   mucoid cyst lt long finger   JOINT REPLACEMENT  8/13   total rt hip-[facility]   TOTAL HIP ARTHROPLASTY     RT 2013   TUBAL LIGATION     FAMILY HISTORY Family History  Problem Relation Age of Onset   Dementia Brother    SOCIAL HISTORY Social History   Tobacco Use   Smoking status: Never   Smokeless tobacco: Never  Vaping Use   Vaping status: Never Used  Substance Use Topics   Alcohol  use: Yes    Alcohol /week: 1.0 standard drink of alcohol     Types: 1 Glasses of wine per week    Comment: occ   Drug use: No       OPHTHALMIC EXAM:  Not recorded    IMAGING AND PROCEDURES  Imaging and Procedures for 08/28/2023         ASSESSMENT/PLAN: No diagnosis found.  Macular cyst OS - small, focal macular cyst in superior fovea  - BCVA OS 20/25 but pt reports subjective metamorphopsia OS  - unclear etiology, but suspect may be residual cystic changes from prior VMT -- OCT today shows no VMT but partial PVD suggestive of recent release of VMT  - discussed findings, prognosis - no retinal or ophthalmic interventions indicated or recommended at this time  -  monitor  - f/u 4-6 months, DFE, OCT  2. Pseudophakia OU  - s/p CE/IOL OU  - IOLs in good position  - monitor  Ophthalmic Meds Ordered this visit:  No orders of the defined types were placed in this encounter.    No follow-ups on file.  There are no Patient Instructions on file for this visit.  Explained the diagnoses, plan, and follow up with the patient and they expressed understanding.  Patient expressed understanding of the importance of proper follow up care.   This document serves as a record of services personally performed by Jeanice Millard, MD, PhD. It was created on their behalf by Angelia Kelp, an ophthalmic technician. The creation of this record is the provider's dictation and/or activities during the visit.    Electronically  signed by: Angelia Kelp, OA, 08/21/23  12:35 PM    Jeanice Millard, M.D., Ph.D. Diseases & Surgery of the Retina and Vitreous [facility] 08/28/2023   Abbreviations: M myopia (nearsighted); A astigmatism; H hyperopia (farsighted); P presbyopia; Mrx spectacle prescription;  CTL contact lenses; OD right eye; OS left eye; OU both eyes  XT exotropia; ET esotropia; PEK punctate epithelial keratitis; PEE punctate epithelial erosions; DES dry eye syndrome; MGD meibomian gland dysfunction; ATs artificial tears; PFAT's preservative free artificial tears; NSC nuclear sclerotic cataract; PSC posterior subcapsular cataract; ERM epi-retinal membrane; PVD posterior vitreous detachment; RD retinal detachment; DM diabetes mellitus; DR diabetic retinopathy; NPDR non-proliferative diabetic retinopathy; PDR proliferative diabetic retinopathy; CSME clinically significant macular edema; DME diabetic macular edema; dbh dot blot hemorrhages; CWS cotton wool spot; POAG primary open angle glaucoma; C/D cup-to-disc ratio; HVF humphrey visual field; GVF goldmann visual field; OCT optical coherence tomography; IOP intraocular pressure; BRVO Branch retinal vein occlusion; CRVO central retinal vein occlusion; CRAO central retinal artery occlusion; BRAO branch retinal artery occlusion; RT retinal tear; SB scleral buckle; PPV pars plana vitrectomy; VH Vitreous hemorrhage; PRP panretinal laser photocoagulation; IVK intravitreal kenalog ; VMT vitreomacular traction; MH Macular hole;  NVD neovascularization of the disc; NVE neovascularization elsewhere; AREDS age related eye disease study; ARMD age related macular degeneration; POAG primary open angle glaucoma; EBMD epithelial/anterior basement membrane dystrophy; ACIOL anterior chamber intraocular lens; IOL intraocular lens; PCIOL posterior chamber intraocular lens; Phaco/IOL phacoemulsification with intraocular lens placement; PRK photorefractive keratectomy;  LASIK laser assisted in situ keratomileusis; HTN hypertension; DM diabetes mellitus; COPD chronic obstructive pulmonary disease

## 2023-08-28 ENCOUNTER — Encounter (INDEPENDENT_AMBULATORY_CARE_PROVIDER_SITE_OTHER): Payer: Self-pay | Admitting: Ophthalmology

## 2023-08-28 ENCOUNTER — Ambulatory Visit (INDEPENDENT_AMBULATORY_CARE_PROVIDER_SITE_OTHER): Admitting: Ophthalmology

## 2023-08-28 DIAGNOSIS — H35342 Macular cyst, hole, or pseudohole, left eye: Secondary | ICD-10-CM | POA: Diagnosis not present

## 2023-08-28 DIAGNOSIS — Z961 Presence of intraocular lens: Secondary | ICD-10-CM | POA: Diagnosis not present

## 2023-09-02 ENCOUNTER — Encounter (INDEPENDENT_AMBULATORY_CARE_PROVIDER_SITE_OTHER): Payer: Self-pay | Admitting: Ophthalmology

## 2023-09-10 ENCOUNTER — Telehealth: Payer: Self-pay | Admitting: Neurology

## 2023-09-10 NOTE — Telephone Encounter (Signed)
 Attempted to call Pt husband. No answer, unable to LVM.

## 2023-09-10 NOTE — Telephone Encounter (Signed)
 Pt husband  called in regards to request earlier appt . Pt husband explained that wife have been having really bad Migraines and its getting worse by the day ,

## 2023-09-11 NOTE — Telephone Encounter (Signed)
 Patient returned phone call, transferred to POD 3

## 2023-09-11 NOTE — Telephone Encounter (Signed)
 Spoke w/Pt spouse regarding earlier appt. Pt husband demanding Pt be seen sooner than 04/29/24 stating Pt is having migraine headaches. Discussed w/husband that Pt has not been seen since 2023. Pt saw NP for an issue other than migraines so Pt will need a new referral for a new issue. Husband then stated that Pt is having worsening memory issues and needs to be seen. Discussed medications Pt was to continue based on visit notes from 06/08/21 - to continue Aricept  10 mg at night and Namenda  10 mg BID. Husband stated Pt is taking Namenda  5 mg BID and is not taking Aricept  and hasn't in a while but he could not recall when she stopped. Explained that Pt will need to see MD and earliest appt is 02/19/24. Husband upset that Pt cannot be seen sooner. Explained that Pt has not been seen since 2023 and this is the first available (Pt had appt on 10/29/23 but had to be rescheduled d/t MD being on vacation). Offered to schedule Pt for 02/19/24 at 1 PM and add Pt to wait list. Husband agreed.

## 2023-09-17 DIAGNOSIS — H8190 Unspecified disorder of vestibular function, unspecified ear: Secondary | ICD-10-CM | POA: Diagnosis not present

## 2023-09-17 DIAGNOSIS — Z681 Body mass index (BMI) 19 or less, adult: Secondary | ICD-10-CM | POA: Diagnosis not present

## 2023-09-17 DIAGNOSIS — R42 Dizziness and giddiness: Secondary | ICD-10-CM | POA: Diagnosis not present

## 2023-09-17 DIAGNOSIS — R519 Headache, unspecified: Secondary | ICD-10-CM | POA: Diagnosis not present

## 2023-09-17 DIAGNOSIS — Z1389 Encounter for screening for other disorder: Secondary | ICD-10-CM | POA: Diagnosis not present

## 2023-09-17 DIAGNOSIS — E039 Hypothyroidism, unspecified: Secondary | ICD-10-CM | POA: Diagnosis not present

## 2023-09-19 DIAGNOSIS — R9089 Other abnormal findings on diagnostic imaging of central nervous system: Secondary | ICD-10-CM | POA: Diagnosis not present

## 2023-09-19 DIAGNOSIS — R42 Dizziness and giddiness: Secondary | ICD-10-CM | POA: Diagnosis not present

## 2023-09-19 DIAGNOSIS — R519 Headache, unspecified: Secondary | ICD-10-CM | POA: Diagnosis not present

## 2023-10-15 DIAGNOSIS — R42 Dizziness and giddiness: Secondary | ICD-10-CM | POA: Diagnosis not present

## 2023-10-15 DIAGNOSIS — R2681 Unsteadiness on feet: Secondary | ICD-10-CM | POA: Diagnosis not present

## 2023-10-25 DIAGNOSIS — R42 Dizziness and giddiness: Secondary | ICD-10-CM | POA: Diagnosis not present

## 2023-10-25 DIAGNOSIS — R2681 Unsteadiness on feet: Secondary | ICD-10-CM | POA: Diagnosis not present

## 2023-10-29 ENCOUNTER — Ambulatory Visit: Admitting: Neurology

## 2023-10-29 DIAGNOSIS — E039 Hypothyroidism, unspecified: Secondary | ICD-10-CM | POA: Diagnosis not present

## 2023-10-31 DIAGNOSIS — R2681 Unsteadiness on feet: Secondary | ICD-10-CM | POA: Diagnosis not present

## 2023-10-31 DIAGNOSIS — R42 Dizziness and giddiness: Secondary | ICD-10-CM | POA: Diagnosis not present

## 2023-11-06 DIAGNOSIS — R2681 Unsteadiness on feet: Secondary | ICD-10-CM | POA: Diagnosis not present

## 2023-11-06 DIAGNOSIS — R42 Dizziness and giddiness: Secondary | ICD-10-CM | POA: Diagnosis not present

## 2023-12-03 DIAGNOSIS — Z23 Encounter for immunization: Secondary | ICD-10-CM | POA: Diagnosis not present

## 2023-12-11 DIAGNOSIS — E039 Hypothyroidism, unspecified: Secondary | ICD-10-CM | POA: Diagnosis not present

## 2023-12-11 DIAGNOSIS — E7849 Other hyperlipidemia: Secondary | ICD-10-CM | POA: Diagnosis not present

## 2023-12-11 DIAGNOSIS — I10 Essential (primary) hypertension: Secondary | ICD-10-CM | POA: Diagnosis not present

## 2023-12-11 NOTE — Progress Notes (Signed)
 [facility] - Clinic Note  12/25/2023   CHIEF COMPLAINT Patient presents for Retina Follow Up  HISTORY OF PRESENT ILLNESS: [name] [name] is a 83 y.o. female who presents to the clinic today for:  HPI     Retina Follow Up   Patient presents with  Other.  In left eye.  This started 4 months ago.  I, the attending physician,  performed the HPI with the patient and updated documentation appropriately.        Comments   Patient here for 4 months retina follow up for mac cyst OS. Patient states vision about the same. No eye pain.       Last edited by Valdemar Rogue, MD on 12/30/2023  3:49 PM.      Pt states vision is stable, no changes. Pt would prefer not to come back so soon.   Referring physician: Nicholaus Willma CROME, DO   HISTORICAL INFORMATION:  Selected notes from the MEDICAL RECORD NUMBER Referred by Dr. Nicholaus for possible macular hole LEE:  Ocular Hx- PMH-   CURRENT MEDICATIONS: Current Outpatient Medications (Ophthalmic Drugs)  Medication Sig   Polyethyl Glycol-Propyl Glycol 0.4-0.3 % SOLN Place 1 drop into both eyes as needed.   No current facility-administered medications for this visit. (Ophthalmic Drugs)   Current Outpatient Medications (Other)  Medication Sig   Biotin 10 MG TABS Take by mouth.   cholecalciferol (VITAMIN D3) 25 MCG (1000 UNIT) tablet Take 1,000 Units by mouth daily.   donepezil  (ARICEPT ) 10 MG tablet Take 1 tablet (10 mg total) by mouth at bedtime.   levothyroxine (SYNTHROID) 88 MCG tablet Take 88 mcg by mouth daily before breakfast.   memantine  (NAMENDA ) 10 MG tablet Take 1 tablet (10 mg total) by mouth 2 (two) times daily.   Omega-3 Fatty Acids (FISH OIL ) 1200 MG CPDR Take 1 capsule by mouth daily.   valACYclovir (VALTREX) 500 MG tablet Take 500 mg by mouth daily.   No current facility-administered medications for this visit. (Other)   REVIEW OF SYSTEMS: ROS   Positive for: Neurological, Eyes Last edited by  Orval Asberry RAMAN, COA on 12/25/2023 12:56 PM.      ALLERGIES Allergies  Allergen Reactions   Other Other (See Comments)    Unknown Anesthesia=vomiting   PAST MEDICAL HISTORY Past Medical History:  Diagnosis Date   Arthritis    Fever blister    GERD (gastroesophageal reflux disease)    Headache    Hypothyroidism    Past Surgical History:  Procedure Laterality Date   BREAST CYST EXCISION Right    BUNIONECTOMY  1993   both feet   COLONOSCOPY     DILATION AND CURETTAGE OF UTERUS     ERCP  2007   FINGER SURGERY  2009   mucoid cyst lt long finger   JOINT REPLACEMENT  8/13   total rt hip-[facility]   TOTAL HIP ARTHROPLASTY     RT 2013   TUBAL LIGATION     FAMILY HISTORY Family History  Problem Relation Age of Onset   Dementia Brother    SOCIAL HISTORY Social History   Tobacco Use   Smoking status: Never   Smokeless tobacco: Never  Vaping Use   Vaping status: Never Used  Substance Use Topics   Alcohol  use: Yes    Alcohol /week: 1.0 standard drink of alcohol     Types: 1 Glasses of wine per week    Comment: occ   Drug use: No  OPHTHALMIC EXAM:  Base Eye Exam     Visual Acuity (Snellen - Linear)       Right Left   Dist cc 20/30 +2 20/40 -1   Dist ph cc NI NI    Correction: Glasses         Tonometry (Tonopen, 12:54 PM)       Right Left   Pressure 20 15         Pupils       Dark Light Shape React APD   Right 3 2 Round Brisk None   Left 3 2 Round Brisk None         Visual Fields (Counting fingers)       Left Right    Full Full         Extraocular Movement       Right Left    Full, Ortho Full, Ortho         Neuro/Psych     Oriented x3: Yes   Mood/Affect: Normal         Dilation     Both eyes: 1.0% Mydriacyl, 2.5% Phenylephrine @ 12:54 PM           Slit Lamp and Fundus Exam     Slit Lamp Exam       Right Left   Lids/Lashes Dermatochalasis - upper lid Dermatochalasis - upper lid   Conjunctiva/Sclera White  and quiet White and quiet   Cornea Trace tear film debris, well healed cataract wound, 1+ PEE Trace tear film debris, well healed cataract wound, 2+inferior  Punctate epithelial erosions   Anterior Chamber deep and clear, narrow temporal angle deep and clear   Iris Round and dilated Round and dilated   Lens PC IOL in good position, trace PCO PC IOL in good position, 1+ Posterior capsular opacification, temporal   Anterior Vitreous mild syneresis, Posterior vitreous detachment mild syneresis         Fundus Exam       Right Left   Disc Pink and Sharp, Compact, temporal PPP Pink and Sharp, Compact, temporal PPA   C/D Ratio 0.4 0.5   Macula Flat, Blunted foveal reflex, RPE mottling, No heme or edema Flat, Good foveal reflex, trace central cyst, no heme, no frank edema   Vessels mild attenuation, mild tortuosity, milld AV crossing changes mild attenuation, mild tortuosity   Periphery Attached, no heme Attached, no heme           Refraction     Wearing Rx       Sphere Cylinder Axis Add   Right -0.75 +0.75 174 +2.25   Left -0.50 +1.25 169 +2.25           IMAGING AND PROCEDURES  Imaging and Procedures for 12/25/2023  OCT, Retina - OU - Both Eyes       Right Eye Quality was good. Central Foveal Thickness: 260. Progression has been stable. Findings include normal foveal contour, no IRF, no SRF.   Left Eye Quality was good. Central Foveal Thickness: 272. Progression has been stable. Findings include normal foveal contour, no SRF, intraretinal fluid (Focal central cyst superior fovea, Partial PVD).   Notes *Images captured and stored on drive  Diagnosis / Impression:  OD: NFP, no IRF/SRF OS: Focal central cyst superior fovea, Partial PVD  Clinical management:  See below  Abbreviations: NFP - Normal foveal profile. CME - cystoid macular edema. PED - pigment epithelial detachment. IRF - intraretinal fluid. SRF - subretinal fluid. EZ -  ellipsoid zone. ERM - epiretinal  membrane. ORA - outer retinal atrophy. ORT - outer retinal tubulation. SRHM - subretinal hyper-reflective material. IRHM - intraretinal hyper-reflective material            ASSESSMENT/PLAN:   ICD-10-CM   1. Macular retinal cyst of left eye  H35.342 OCT, Retina - OU - Both Eyes    2. Pseudophakia, both eyes  Z96.1       Macular cyst OS - small, focal macular cyst in superior fovea  - BCVA OS 20/25 but pt reports subjective metamorphopsia OS  - unclear etiology, but suspect may be residual cystic changes from prior VMT -- OCT today shows Focal central cyst superior fovea, Partial PVD--stable from prior  - discussed findings, prognosis - no retinal or ophthalmic interventions indicated or recommended at this time  - monitor  - f/u 1 year, DFE, OCT  2. Pseudophakia OU  - s/p CE/IOL OU  - IOLs in good position  - monitor  Ophthalmic Meds Ordered this visit:  No orders of the defined types were placed in this encounter.    Return in about 1 year (around 12/24/2024) for mac cyst OS, DFE, OCT.  There are no Patient Instructions on file for this visit.  Explained the diagnoses, plan, and follow up with the patient and they expressed understanding.  Patient expressed understanding of the importance of proper follow up care.   This document serves as a record of services personally performed by Redell JUDITHANN Hans, MD, PhD. It was created on their behalf by Wanda GEANNIE Keens, COT an ophthalmic technician. The creation of this record is the provider's dictation and/or activities during the visit.    Electronically signed by:  Wanda GEANNIE Keens, COT  12/30/23 3:50 PM   This document serves as a record of services personally performed by Redell JUDITHANN Hans, MD, PhD. It was created on their behalf by Almetta Pesa, an ophthalmic technician. The creation of this record is the provider's dictation and/or activities during the visit.    Electronically signed by: Almetta Pesa, OA,  12/30/23  3:50 PM   Redell JUDITHANN Hans, M.D., Ph.D. Diseases & Surgery of the Retina and Vitreous [facility] 12/25/2023  I have reviewed the above documentation for accuracy and completeness, and I agree with the above. Redell JUDITHANN Hans, M.D., Ph.D. 12/30/23 3:50 PM   Abbreviations: M myopia (nearsighted); A astigmatism; H hyperopia (farsighted); P presbyopia; Mrx spectacle prescription;  CTL contact lenses; OD right eye; OS left eye; OU both eyes  XT exotropia; ET esotropia; PEK punctate epithelial keratitis; PEE punctate epithelial erosions; DES dry eye syndrome; MGD meibomian gland dysfunction; ATs artificial tears; PFAT's preservative free artificial tears; NSC nuclear sclerotic cataract; PSC posterior subcapsular cataract; ERM epi-retinal membrane; PVD posterior vitreous detachment; RD retinal detachment; DM diabetes mellitus; DR diabetic retinopathy; NPDR non-proliferative diabetic retinopathy; PDR proliferative diabetic retinopathy; CSME clinically significant macular edema; DME diabetic macular edema; dbh dot blot hemorrhages; CWS cotton wool spot; POAG primary open angle glaucoma; C/D cup-to-disc ratio; HVF humphrey visual field; GVF goldmann visual field; OCT optical coherence tomography; IOP intraocular pressure; BRVO Branch retinal vein occlusion; CRVO central retinal vein occlusion; CRAO central retinal artery occlusion; BRAO branch retinal artery occlusion; RT retinal tear; SB scleral buckle; PPV pars plana vitrectomy; VH Vitreous hemorrhage; PRP panretinal laser photocoagulation; IVK intravitreal kenalog ; VMT vitreomacular traction; MH Macular hole;  NVD neovascularization of the disc; NVE neovascularization elsewhere; AREDS age related eye disease study; ARMD age  related macular degeneration; POAG primary open angle glaucoma; EBMD epithelial/anterior basement membrane dystrophy; ACIOL anterior chamber intraocular lens; IOL intraocular lens; PCIOL posterior chamber  intraocular lens; Phaco/IOL phacoemulsification with intraocular lens placement; PRK photorefractive keratectomy; LASIK laser assisted in situ keratomileusis; HTN hypertension; DM diabetes mellitus; COPD chronic obstructive pulmonary disease

## 2023-12-25 ENCOUNTER — Ambulatory Visit (INDEPENDENT_AMBULATORY_CARE_PROVIDER_SITE_OTHER): Admitting: Ophthalmology

## 2023-12-25 ENCOUNTER — Encounter (INDEPENDENT_AMBULATORY_CARE_PROVIDER_SITE_OTHER): Payer: Self-pay | Admitting: Ophthalmology

## 2023-12-25 DIAGNOSIS — Z961 Presence of intraocular lens: Secondary | ICD-10-CM

## 2023-12-25 DIAGNOSIS — H35342 Macular cyst, hole, or pseudohole, left eye: Secondary | ICD-10-CM

## 2023-12-30 ENCOUNTER — Encounter (INDEPENDENT_AMBULATORY_CARE_PROVIDER_SITE_OTHER): Payer: Self-pay | Admitting: Ophthalmology

## 2024-02-18 ENCOUNTER — Telehealth: Payer: Self-pay | Admitting: Neurology

## 2024-02-18 NOTE — Telephone Encounter (Signed)
 Pt husband called to  Cancel Pt appt due to weather appt Canceled

## 2024-02-19 ENCOUNTER — Ambulatory Visit: Admitting: Neurology

## 2024-04-29 ENCOUNTER — Ambulatory Visit: Admitting: Neurology

## 2024-12-24 ENCOUNTER — Encounter (INDEPENDENT_AMBULATORY_CARE_PROVIDER_SITE_OTHER): Admitting: Ophthalmology
